# Patient Record
Sex: Male | Born: 1970
Health system: Southern US, Community
[De-identification: ages and names within clinical notes are randomized; demographics above are authoritative.]

## PROBLEM LIST (undated history)

## (undated) DIAGNOSIS — E785 Hyperlipidemia, unspecified: Secondary | ICD-10-CM

## (undated) DIAGNOSIS — J449 Chronic obstructive pulmonary disease, unspecified: Secondary | ICD-10-CM

## (undated) HISTORY — DX: Hyperlipidemia, unspecified: E78.5

## (undated) HISTORY — PX: FINGER SURGERY: SHX640

---

## 2017-08-25 ENCOUNTER — Inpatient Hospital Stay (HOSPITAL_COMMUNITY)
Admission: EM | Admit: 2017-08-25 | Discharge: 2017-08-27 | DRG: 202 | Disposition: A | Discharge status: Home / Self Care | Payer: Medicaid Other | Attending: Internal Medicine | Admitting: Internal Medicine

## 2017-08-25 ENCOUNTER — Other Ambulatory Visit: Payer: Self-pay

## 2017-08-25 ENCOUNTER — Emergency Department (HOSPITAL_COMMUNITY): Payer: Medicaid Other

## 2017-08-25 ENCOUNTER — Encounter (HOSPITAL_COMMUNITY): Payer: Self-pay

## 2017-08-25 DIAGNOSIS — F17210 Nicotine dependence, cigarettes, uncomplicated: Secondary | ICD-10-CM | POA: Diagnosis present

## 2017-08-25 DIAGNOSIS — R1013 Epigastric pain: Secondary | ICD-10-CM | POA: Diagnosis present

## 2017-08-25 DIAGNOSIS — Z8659 Personal history of other mental and behavioral disorders: Secondary | ICD-10-CM

## 2017-08-25 DIAGNOSIS — B349 Viral infection, unspecified: Secondary | ICD-10-CM | POA: Diagnosis present

## 2017-08-25 DIAGNOSIS — J449 Chronic obstructive pulmonary disease, unspecified: Secondary | ICD-10-CM | POA: Diagnosis present

## 2017-08-25 DIAGNOSIS — J440 Chronic obstructive pulmonary disease with acute lower respiratory infection: Secondary | ICD-10-CM | POA: Diagnosis present

## 2017-08-25 DIAGNOSIS — Z720 Tobacco use: Secondary | ICD-10-CM | POA: Diagnosis present

## 2017-08-25 DIAGNOSIS — J9601 Acute respiratory failure with hypoxia: Secondary | ICD-10-CM | POA: Diagnosis present

## 2017-08-25 DIAGNOSIS — J209 Acute bronchitis, unspecified: Principal | ICD-10-CM | POA: Diagnosis present

## 2017-08-25 DIAGNOSIS — J441 Chronic obstructive pulmonary disease with (acute) exacerbation: Secondary | ICD-10-CM | POA: Diagnosis present

## 2017-08-25 HISTORY — DX: Chronic obstructive pulmonary disease, unspecified: J44.9

## 2017-08-25 LAB — COMPREHENSIVE METABOLIC PANEL
ALBUMIN: 3.6 g/dL (ref 3.5–5.0)
ALT: 21 U/L (ref 17–63)
ANION GAP: 11 (ref 5–15)
AST: 23 U/L (ref 15–41)
Alkaline Phosphatase: 69 U/L (ref 38–126)
BUN: 7 mg/dL (ref 6–20)
CO2: 24 mmol/L (ref 22–32)
Calcium: 8.7 mg/dL — ABNORMAL LOW (ref 8.9–10.3)
Chloride: 106 mmol/L (ref 101–111)
Creatinine, Ser: 0.88 mg/dL (ref 0.61–1.24)
GFR calc Af Amer: >60 mL/min (ref >60)
GFR calc non Af Amer: >60 mL/min (ref >60)
GLUCOSE: 110 mg/dL — AB (ref 65–99)
Potassium: 3.7 mmol/L (ref 3.5–5.1)
SODIUM: 141 mmol/L (ref 135–145)
Total Bilirubin: 0.2 mg/dL — ABNORMAL LOW (ref 0.3–1.2)
Total Protein: 6.7 g/dL (ref 6.5–8.1)

## 2017-08-25 LAB — CBC WITH DIFFERENTIAL/PLATELET
Basophils Absolute: 0 10*3/uL (ref 0.0–0.1)
Basophils Relative: 0 %
Eosinophils Absolute: 0.1 10*3/uL (ref 0.0–0.7)
Eosinophils Relative: 1 %
HEMATOCRIT: 47.2 % (ref 39.0–52.0)
Hemoglobin: 15.7 g/dL (ref 13.0–17.0)
LYMPHS ABS: 1.4 10*3/uL (ref 0.7–4.0)
Lymphocytes Relative: 13 %
MCH: 29.5 pg (ref 26.0–34.0)
MCHC: 33.3 g/dL (ref 30.0–36.0)
MCV: 88.7 fL (ref 78.0–100.0)
MONO ABS: 0.7 10*3/uL (ref 0.1–1.0)
MONOS PCT: 6 %
NEUTROS ABS: 8.3 10*3/uL — AB (ref 1.7–7.7)
Neutrophils Relative %: 80 %
Platelets: 190 10*3/uL (ref 150–400)
RBC: 5.32 MIL/uL (ref 4.22–5.81)
RDW: 14 % (ref 11.5–15.5)
WBC: 10.4 10*3/uL (ref 4.0–10.5)

## 2017-08-25 LAB — I-STAT TROPONIN, ED: Troponin i, poc: 0 ng/mL (ref 0.00–0.08)

## 2017-08-25 LAB — INFLUENZA PANEL BY PCR (TYPE A & B)
INFLAPCR: NEGATIVE
Influenza B By PCR: NEGATIVE

## 2017-08-25 LAB — I-STAT CG4 LACTIC ACID, ED: Lactic Acid, Venous: 1.2 mmol/L (ref 0.5–1.9)

## 2017-08-25 MED ORDER — SODIUM CHLORIDE 0.9 % IV BOLUS (SEPSIS)
500.0000 mL | Freq: Once | INTRAVENOUS | Status: AC
  Administered 2017-08-25: 500 mL via INTRAVENOUS

## 2017-08-25 MED ORDER — ALBUTEROL (5 MG/ML) CONTINUOUS INHALATION SOLN
10.0000 mg/h | INHALATION_SOLUTION | Freq: Once | RESPIRATORY_TRACT | Status: AC
  Administered 2017-08-25: 10 mg/h via RESPIRATORY_TRACT
  Filled 2017-08-25: qty 20

## 2017-08-25 MED ORDER — ALBUTEROL SULFATE (2.5 MG/3ML) 0.083% IN NEBU
5.0000 mg | INHALATION_SOLUTION | Freq: Once | RESPIRATORY_TRACT | Status: AC
  Administered 2017-08-25: 5 mg via RESPIRATORY_TRACT
  Filled 2017-08-25: qty 6

## 2017-08-25 MED ORDER — OXYCODONE-ACETAMINOPHEN 5-325 MG PO TABS
1.0000 | ORAL_TABLET | Freq: Once | ORAL | Status: AC
  Administered 2017-08-25: 1 via ORAL
  Filled 2017-08-25: qty 1

## 2017-08-25 MED ORDER — FENTANYL CITRATE (PF) 100 MCG/2ML IJ SOLN
25.0000 ug | Freq: Once | INTRAMUSCULAR | Status: AC
Start: 2017-08-25 — End: 2017-08-25
  Administered 2017-08-25: 25 ug via INTRAMUSCULAR
  Filled 2017-08-25: qty 2

## 2017-08-25 MED ORDER — IBUPROFEN 800 MG PO TABS
800.0000 mg | ORAL_TABLET | Freq: Once | ORAL | Status: AC
  Administered 2017-08-25: 800 mg via ORAL
  Filled 2017-08-25: qty 1

## 2017-08-25 MED ORDER — IPRATROPIUM BROMIDE 0.02 % IN SOLN
0.5000 mg | Freq: Once | RESPIRATORY_TRACT | Status: AC
  Administered 2017-08-25: 0.5 mg via RESPIRATORY_TRACT
  Filled 2017-08-25: qty 2.5

## 2017-08-25 MED ORDER — METHYLPREDNISOLONE SODIUM SUCC 125 MG IJ SOLR
125.0000 mg | Freq: Once | INTRAMUSCULAR | Status: AC
  Administered 2017-08-25: 125 mg via INTRAVENOUS
  Filled 2017-08-25: qty 2

## 2017-08-25 NOTE — ED Provider Notes (Signed)
[facility] EMERGENCY DEPARTMENT Provider Note   CSN: [id number] Arrival date & time: 08/25/17  1230     History   Chief Complaint Chief Complaint  Patient presents with  . Shortness of Breath    HPI Randall Barry is a 47 y.o. male.  HPI   Pt is a 47 year old male with a history of tobacco use presents to the ED today complaining of worsening shortness of breath with exertion that began about a week ago, but worsened over the last 2 days.  States he has had subjective fever and chills as well as body aches.  He has had cough,, congestion, rhinorrhea, mild sore throat.  States he was initially his cough with productive with yellow sputum, now has become dry.  Denies any hemoptysis.  Denies any chest pain.  Also with mild headache.  No vision changes, lightheadedness, dizziness, numbness/tingling/weakness to bilateral upper or lower extreme ease.  No abdominal pain, nausea, vomiting, diarrhea.  No lower extremity swelling or edema.  No pain in the calves.  States he has no past medical history.  History reviewed. No pertinent past medical history.  There are no active problems to display for this patient.   History reviewed. No pertinent surgical history.     Home Medications    Prior to Admission medications   Medication Sig Start Date End Date Taking? Authorizing Provider  ibuprofen (ADVIL,MOTRIN) 200 MG tablet Take 200 mg by mouth every 6 (six) hours as needed.   Yes [provider]    Family History History reviewed. No pertinent family history.  Social History Social History   Tobacco Use  . Smoking status: Current Every Day Smoker    Packs/day: 1.50  Substance Use Topics  . Alcohol use: Yes    Comment: occ  . Drug use: Yes    Types: Marijuana    Comment: occ     Allergies   Patient has no known allergies.   Review of Systems Review of Systems  Constitutional: Positive for chills.  HENT: Positive for congestion,  rhinorrhea, sinus pressure, sinus pain and sore throat. Negative for trouble swallowing.   Eyes: Negative for pain and visual disturbance.  Respiratory: Positive for cough, shortness of breath and wheezing.   Cardiovascular: Negative for chest pain, palpitations and leg swelling.  Gastrointestinal: Negative for abdominal pain, constipation, diarrhea, nausea and vomiting.  Genitourinary: Negative for dysuria and hematuria.  Musculoskeletal: Negative for back pain, neck pain and neck stiffness.  Skin: Negative for rash.  Neurological: Positive for headaches. Negative for dizziness, syncope, weakness, light-headedness and numbness.  All other systems reviewed and are negative.    Physical Exam Updated Vital Signs BP 98/64   Pulse 74   Temp 98 F (36.7 C) (Oral)   Resp (!) 23   Ht 5\' 9"  (1.753 m) Comment: Simultaneous filing. User may not have seen previous data.  Wt 54.4 kg (120 lb) Comment: Simultaneous filing. User may not have seen previous data.  SpO2 97%   BMI 17.72 kg/m   Physical Exam  Constitutional: He appears well-developed and well-nourished.  Appears uncomfortable.  HENT:  Head: Normocephalic and atraumatic.  Mouth/Throat: Oropharynx is clear and moist.  Mild pharyngeal erythema.  No tonsillar swelling or exudates.  Uvula midline.  Eyes: Conjunctivae and EOM are normal. Pupils are equal, round, and reactive to light.  Neck: Normal range of motion. Neck supple. No JVD present.  Cardiovascular: Normal rate, regular rhythm, normal heart sounds and intact distal pulses.  No murmur heard. Pulmonary/Chest: He is in respiratory distress.  Tachypnea noted.  Decreased breath sounds throughout.  Wheezing noted diffusely.  No rales or rhonchi.  Patient is tripoding.  He is speaking in full sentences.  Has cough on exam.  Abdominal: Soft. Bowel sounds are normal. He exhibits no distension. There is no tenderness.  Musculoskeletal: He exhibits no edema.       Right lower leg:  Normal. He exhibits no tenderness and no edema.       Left lower leg: Normal. He exhibits no tenderness and no edema.  Neurological: He is alert.  Mental Status:  Alert, thought content appropriate, able to give a coherent history. Speech fluent without evidence of aphasia. Able to follow 2 step commands without difficulty.  Cranial Nerves:  II:   pupils equal, round, reactive to light III,IV, VI: ptosis not present, extra-ocular motions intact bilaterally  V,VII: smile symmetric, facial light touch sensation equal VIII: hearing grossly normal to voice  X: uvula elevates symmetrically  XI: bilateral shoulder shrug symmetric and strong XII: midline tongue extension without fassiculations Motor:  Normal tone. 5/5 strength of BUE and BLE major muscle groups including strong and equal grip strength and dorsiflexion/plantar flexion Sensory: light touch normal in all extremities. Cerebellar: normal finger-to-nose with bilateral upper extremities Gait: normal gait and balance. Able to walk on toes and heels with ease.  CV: 2+ radial and DP/PT pulses  Skin: Skin is warm and dry. Capillary refill takes less than 2 seconds.  Psychiatric: He has a normal mood and affect.  Nursing note and vitals reviewed.    ED Treatments / Results  Labs (all labs ordered are listed, but only abnormal results are displayed) Labs Reviewed  COMPREHENSIVE METABOLIC PANEL - Abnormal; Notable for the following components:      Result Value   Glucose, Bld 110 (*)    Calcium 8.7 (*)    Total Bilirubin 0.2 (*)    All other components within normal limits  CBC WITH DIFFERENTIAL/PLATELET - Abnormal; Notable for the following components:   Neutro Abs 8.3 (*)    All other components within normal limits  INFLUENZA PANEL BY PCR (TYPE A & B)  I-STAT CG4 LACTIC ACID, ED  I-STAT TROPONIN, ED    EKG  EKG Interpretation None       Radiology Dg Chest 2 View  Result Date: 08/25/2017 CLINICAL DATA:  Short of  breath EXAM: CHEST - 2 VIEW COMPARISON:  None. FINDINGS: Severe COPD with hyperinflation and emphysema. No focal infiltrate or effusion. Negative for heart failure or mass. Heart size is relatively small. IMPRESSION: Severe COPD without acute abnormality. Electronically Signed   By: Marlan Palauharles  Clark M.D.   On: 08/25/2017 12:58    Procedures Procedures (including critical care time) CRITICAL CARE Performed by: Karrie Meresortni S Ivory Maduro   Total critical care time: 45minutes  Critical care time was exclusive of separately billable procedures and treating other patients.  Critical care was necessary to treat or prevent imminent or life-threatening deterioration.  Critical care was time spent personally by me on the following activities: development of treatment plan with patient and/or surrogate as well as nursing, discussions with consultants, evaluation of patient's response to treatment, examination of patient, obtaining history from patient or surrogate, ordering and performing treatments and interventions, ordering and review of laboratory studies, ordering and review of radiographic studies, pulse oximetry and re-evaluation of patient's condition.   Medications Ordered in ED Medications  ipratropium (ATROVENT) nebulizer solution 0.5 mg (0.5  mg Nebulization Given 08/25/17 1504)  albuterol (PROVENTIL) (2.5 MG/3ML) 0.083% nebulizer solution 5 mg (5 mg Nebulization Given 08/25/17 1504)  methylPREDNISolone sodium succinate (SOLU-MEDROL) 125 mg/2 mL injection 125 mg (125 mg Intravenous Given 08/25/17 1545)  ibuprofen (ADVIL,MOTRIN) tablet 800 mg (800 mg Oral Given 08/25/17 1558)  albuterol (PROVENTIL) (2.5 MG/3ML) 0.083% nebulizer solution 5 mg (5 mg Nebulization Given 08/25/17 1558)  ipratropium (ATROVENT) nebulizer solution 0.5 mg (0.5 mg Nebulization Given 08/25/17 1557)  albuterol (PROVENTIL,VENTOLIN) solution continuous neb (10 mg/hr Nebulization Given 08/25/17 1727)  fentaNYL (SUBLIMAZE) injection 25 mcg  (25 mcg Intramuscular Given 08/25/17 1750)  sodium chloride 0.9 % bolus 500 mL (0 mLs Intravenous Stopped 08/25/17 2007)  albuterol (PROVENTIL,VENTOLIN) solution continuous neb (10 mg/hr Nebulization Given 08/25/17 1945)  oxyCODONE-acetaminophen (PERCOCET/ROXICET) 5-325 MG per tablet 1 tablet (1 tablet Oral Given 08/25/17 2146)     Initial Impression / Assessment and Plan / ED Course  I have reviewed the triage vital signs and the nursing notes.  Pertinent labs & imaging results that were available during my care of the patient were reviewed by me and considered in my medical decision making (see chart for details).  Discussed pt presentation and exam findings with Dr. Effie Shy, who agrees with the plan to admit pt for COPD exacerbation.     Patient still has decreased breath sounds throughout after first nebulizer treatment.  Does still have wheezing present.  Repeat pulmonary exam after second nebulizer treatment reveals continued decreased lung sounds.  Patient still tripoding.  Will initiate continuous nebulizer treatment.  Rechecked patient, laying on back with normal respiratory rate.  States he feels short of breath still.  Has not received continuous nebulizer treatment.  Still tachypneic and out of breath after continuous nebulizer treatment.  Ambulated and sats dropped to 90%.  Will admit to hospitalist.  11:02 PM Discussed case with Dr. Mariea Clonts who will admit the pt to the hospitalist service.  Final Clinical Impressions(s) / ED Diagnoses   Final diagnoses:  COPD exacerbation (HCC)   47 y/o male h/o tobacco use presenting with 1 week h/o dyspnea.   Afebrile with normal BP and HR. O2 sats in low 90s on RA. And tachypneic throughout stay in ED.  ECG with NSR Hr 91.  No ST elevations or depressions suggestive of ischemia. RAE noted. Doubt ACS as no CP. Doubt PE. Doubt CHF.  Chest x-ray shows severe COPD without evidence of pneumonia, pneumothorax.  No cardiomegaly.  Patient  has no prior diagnosis of COPD and is not currently being treated for this and does not use oxygen at home, however does have significant risk factors and appears to have COPD exacerbation today.  Patient given 2 DuoNeb treatments and two continuous nebs as well as Solu-Medrol.  Was emulated and sats dropped to 90% on room air and patient was taken back with this. Continues to feel dyspneic and has been placed on 2L [place].   Will require admission for further tx of COPD exacerbation. Admitted to hospitalist service.  ED Discharge Orders    None       Rayne Du 08/25/17 2303    Mancel Bale, MD 08/27/17 [phone]

## 2017-08-25 NOTE — ED Notes (Signed)
Pt again c/o headache.

## 2017-08-25 NOTE — ED Triage Notes (Signed)
Pt endorses shob, worse with exertion x 2 days. Pt has had cold sx and chills since last week. Able to speak in complete sentences.

## 2017-08-25 NOTE — ED Notes (Signed)
Ambulated PT in hallway.  O2 stayed at 90%.  PT seemed out of breath while walking.

## 2017-08-26 ENCOUNTER — Encounter (HOSPITAL_COMMUNITY): Payer: Self-pay | Admitting: *Deleted

## 2017-08-26 ENCOUNTER — Other Ambulatory Visit: Payer: Self-pay

## 2017-08-26 DIAGNOSIS — F17210 Nicotine dependence, cigarettes, uncomplicated: Secondary | ICD-10-CM | POA: Diagnosis present

## 2017-08-26 DIAGNOSIS — B349 Viral infection, unspecified: Secondary | ICD-10-CM | POA: Diagnosis present

## 2017-08-26 DIAGNOSIS — J449 Chronic obstructive pulmonary disease, unspecified: Secondary | ICD-10-CM | POA: Diagnosis present

## 2017-08-26 DIAGNOSIS — J441 Chronic obstructive pulmonary disease with (acute) exacerbation: Secondary | ICD-10-CM | POA: Diagnosis present

## 2017-08-26 DIAGNOSIS — J208 Acute bronchitis due to other specified organisms: Secondary | ICD-10-CM

## 2017-08-26 DIAGNOSIS — J440 Chronic obstructive pulmonary disease with acute lower respiratory infection: Secondary | ICD-10-CM | POA: Diagnosis present

## 2017-08-26 DIAGNOSIS — Z8659 Personal history of other mental and behavioral disorders: Secondary | ICD-10-CM | POA: Diagnosis not present

## 2017-08-26 DIAGNOSIS — R1013 Epigastric pain: Secondary | ICD-10-CM | POA: Diagnosis present

## 2017-08-26 DIAGNOSIS — J209 Acute bronchitis, unspecified: Secondary | ICD-10-CM | POA: Diagnosis present

## 2017-08-26 DIAGNOSIS — Z720 Tobacco use: Secondary | ICD-10-CM | POA: Diagnosis present

## 2017-08-26 DIAGNOSIS — J9601 Acute respiratory failure with hypoxia: Secondary | ICD-10-CM | POA: Diagnosis present

## 2017-08-26 LAB — RAPID URINE DRUG SCREEN, HOSP PERFORMED
AMPHETAMINES: NOT DETECTED
BARBITURATES: NOT DETECTED
BENZODIAZEPINES: NOT DETECTED
Cocaine: NOT DETECTED
Opiates: NOT DETECTED
Tetrahydrocannabinol: POSITIVE — AB

## 2017-08-26 LAB — TROPONIN I
Troponin I: <0.03 ng/mL (ref <0.03)
Troponin I: <0.03 ng/mL (ref <0.03)

## 2017-08-26 LAB — HIV ANTIBODY (ROUTINE TESTING W REFLEX): HIV SCREEN 4TH GENERATION: NONREACTIVE

## 2017-08-26 MED ORDER — IPRATROPIUM-ALBUTEROL 0.5-2.5 (3) MG/3ML IN SOLN
3.0000 mL | Freq: Four times a day (QID) | RESPIRATORY_TRACT | Status: DC
  Administered 2017-08-27 (×3): 3 mL via RESPIRATORY_TRACT
  Filled 2017-08-26 (×3): qty 3

## 2017-08-26 MED ORDER — DM-GUAIFENESIN ER 30-600 MG PO TB12
1.0000 | ORAL_TABLET | Freq: Two times a day (BID) | ORAL | Status: DC
  Administered 2017-08-26 – 2017-08-27 (×4): 1 via ORAL
  Filled 2017-08-26 (×4): qty 1

## 2017-08-26 MED ORDER — ENOXAPARIN SODIUM 40 MG/0.4ML ~~LOC~~ SOLN
40.0000 mg | SUBCUTANEOUS | Status: DC
  Administered 2017-08-26: 40 mg via SUBCUTANEOUS
  Filled 2017-08-26 (×2): qty 0.4

## 2017-08-26 MED ORDER — FAMOTIDINE 20 MG PO TABS
20.0000 mg | ORAL_TABLET | Freq: Every day | ORAL | Status: DC
  Administered 2017-08-26 (×2): 20 mg via ORAL
  Filled 2017-08-26 (×2): qty 1

## 2017-08-26 MED ORDER — SODIUM CHLORIDE 0.9 % IV BOLUS (SEPSIS)
1000.0000 mL | Freq: Once | INTRAVENOUS | Status: AC
  Administered 2017-08-26: 1000 mL via INTRAVENOUS

## 2017-08-26 MED ORDER — METHYLPREDNISOLONE SODIUM SUCC 40 MG IJ SOLR
40.0000 mg | Freq: Two times a day (BID) | INTRAMUSCULAR | Status: DC
  Administered 2017-08-26 – 2017-08-27 (×3): 40 mg via INTRAVENOUS
  Filled 2017-08-26 (×3): qty 1

## 2017-08-26 MED ORDER — BUDESONIDE 0.5 MG/2ML IN SUSP
0.5000 mg | Freq: Two times a day (BID) | RESPIRATORY_TRACT | Status: DC
  Administered 2017-08-26 – 2017-08-27 (×3): 0.5 mg via RESPIRATORY_TRACT
  Filled 2017-08-26 (×3): qty 2

## 2017-08-26 MED ORDER — IPRATROPIUM-ALBUTEROL 0.5-2.5 (3) MG/3ML IN SOLN: 3.0000 mL | Freq: Four times a day (QID) | RESPIRATORY_TRACT | Status: DC

## 2017-08-26 MED ORDER — ALBUTEROL SULFATE (2.5 MG/3ML) 0.083% IN NEBU: 2.5000 mg | INHALATION_SOLUTION | RESPIRATORY_TRACT | Status: DC | PRN

## 2017-08-26 MED ORDER — KETOROLAC TROMETHAMINE 30 MG/ML IJ SOLN: 30.0000 mg | Freq: Three times a day (TID) | INTRAMUSCULAR | Status: AC | PRN

## 2017-08-26 MED ORDER — NICOTINE 21 MG/24HR TD PT24
21.0000 mg | MEDICATED_PATCH | Freq: Every day | TRANSDERMAL | Status: DC
  Administered 2017-08-26 – 2017-08-27 (×2): 21 mg via TRANSDERMAL
  Filled 2017-08-26 (×3): qty 1

## 2017-08-26 MED ORDER — IBUPROFEN 600 MG PO TABS
600.0000 mg | ORAL_TABLET | Freq: Four times a day (QID) | ORAL | Status: AC | PRN
  Administered 2017-08-26 (×2): 600 mg via ORAL
  Filled 2017-08-26 (×2): qty 1

## 2017-08-26 MED ORDER — CALCIUM CARBONATE ANTACID 500 MG PO CHEW
1.0000 | CHEWABLE_TABLET | Freq: Two times a day (BID) | ORAL | Status: DC
Start: 2017-08-26 — End: 2017-08-27
  Administered 2017-08-26 – 2017-08-27 (×4): 200 mg via ORAL
  Filled 2017-08-26 (×5): qty 1

## 2017-08-26 MED ORDER — IPRATROPIUM-ALBUTEROL 0.5-2.5 (3) MG/3ML IN SOLN
3.0000 mL | RESPIRATORY_TRACT | Status: DC
  Administered 2017-08-26 (×4): 3 mL via RESPIRATORY_TRACT
  Filled 2017-08-26 (×4): qty 3

## 2017-08-26 MED ORDER — SODIUM CHLORIDE 0.9 % IV SOLN
500.0000 mg | Freq: Every day | INTRAVENOUS | Status: DC
  Administered 2017-08-26 (×2): 500 mg via INTRAVENOUS
  Filled 2017-08-26 (×2): qty 500
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
## 2017-08-26 NOTE — ED Notes (Signed)
Admitting MD at bedside.

## 2017-08-26 NOTE — Plan of Care (Signed)
Discussed with patient plan of care for the unit, pain management and nicotine patch with some teach back displayed

## 2017-08-26 NOTE — Progress Notes (Signed)
Patient admitted by Dr. Mariea ClontsEmokpae. 47 year old with history of tobacco use not on any home medications came to the hospital with complains of shortness of breath for the past 4 days but has progressed over the course of last 2 days.  He was admitted for acute bronchitis with likely underlying COPD exacerbation. This morning patient still admits of feeling short of breath even at rest and unable to bring up any cough but feels very congested. Vital signs remained stable.  He is currently on 2-2 L nasal cannula Breath sounds are very diminished diffusely  At this time we will continue patient on Solu-Medrol 40 mg IV twice daily, nebulizer scheduled and as necessary.  Continue azithromycin. I will add Pulmicort.  Once this episode is resolved he will need outpatient PFTs in about 4 weeks  Please call with further questions as necessary

## 2017-08-27 ENCOUNTER — Telehealth: Payer: Self-pay

## 2017-08-27 DIAGNOSIS — J441 Chronic obstructive pulmonary disease with (acute) exacerbation: Secondary | ICD-10-CM

## 2017-08-27 DIAGNOSIS — Z720 Tobacco use: Secondary | ICD-10-CM

## 2017-08-27 DIAGNOSIS — J209 Acute bronchitis, unspecified: Principal | ICD-10-CM

## 2017-08-27 DIAGNOSIS — J9601 Acute respiratory failure with hypoxia: Secondary | ICD-10-CM

## 2017-08-27 MED ORDER — PREDNISONE 10 MG PO TABS: ORAL_TABLET | ORAL | 0 refills | Status: DC

## 2017-08-27 MED ORDER — ALBUTEROL SULFATE HFA 108 (90 BASE) MCG/ACT IN AERS: 2.0000 | INHALATION_SPRAY | Freq: Four times a day (QID) | RESPIRATORY_TRACT | 0 refills | Status: DC | PRN

## 2017-08-27 MED ORDER — AZITHROMYCIN 250 MG PO TABS
500.0000 mg | ORAL_TABLET | Freq: Once | ORAL | Status: AC
  Administered 2017-08-27: 500 mg via ORAL
  Filled 2017-08-27: qty 2

## 2017-08-27 MED ORDER — PREDNISONE 20 MG PO TABS
40.0000 mg | ORAL_TABLET | Freq: Once | ORAL | Status: AC
  Administered 2017-08-27: 40 mg via ORAL
  Filled 2017-08-27: qty 2

## 2017-08-27 MED ORDER — NICOTINE 21 MG/24HR TD PT24: 21.0000 mg | MEDICATED_PATCH | Freq: Every day | TRANSDERMAL | 0 refills | Status: DC

## 2017-08-27 MED ORDER — ALBUTEROL SULFATE HFA 108 (90 BASE) MCG/ACT IN AERS
2.0000 | INHALATION_SPRAY | Freq: Four times a day (QID) | RESPIRATORY_TRACT | 0 refills | Status: DC | PRN
Start: 2017-08-27 — End: 2017-08-27

## 2017-08-27 MED FILL — NICOTINE 21 MG/24HR PATCH: 21 | 28 days supply | Qty: 28 | Fill #0

## 2017-08-27 MED FILL — predniSONE 10 MG TABS: 10 | 8 days supply | Qty: 20 | Fill #0

## 2017-08-27 MED FILL — ALBUTEROL SULFATE HFA 108 (: 108 (90 BAS | 25 days supply | Qty: 18 | Fill #0

## 2017-08-27 NOTE — Telephone Encounter (Signed)
Message received from Letha Capeeborah Taylor, RN CM requesting a hospital follow up appointment [facility].  An appointment was scheduled for 09/10/17 @ 1000 and the information was placed on the AVS.  Update provided to D. Ladona Ridgelaylor, RN CM

## 2017-08-27 NOTE — Progress Notes (Signed)
Patient has been discharged from the unit. Patient was educated and informed about follow up appointments and medications. His wife also present at bedside at time of teaching. He reports that he has no further questions.

## 2017-08-27 NOTE — Progress Notes (Addendum)
SATURATION QUALIFICATIONS: (This note is used to comply with regulatory documentation for home oxygen)  Patient Saturations on Room Air at Rest = 94%  Patient Saturations on Room Air while Ambulating = 87%  Patient Saturations on 2 Liters of oxygen while Ambulating = 91 %  Please briefly explain why patient needs home oxygen:  Sitting at the bedside patient is O2 sats is in the 94 on room air. While ambulating patient expressed feeling SOB without the O2. Ambulated better while on 2L of [place] and was able to speak more freely with O2.

## 2017-08-27 NOTE — Discharge Instructions (Signed)
Please get your medications reviewed and adjusted by your Primary MD. ° °Please request your Primary MD to go over all Hospital Tests and Procedure/Radiological results at the follow up, please get all Hospital records sent to your Prim MD by signing hospital release before you go home. ° °If you had Pneumonia of Lung problems at the Hospital: °Please get a 2 view Chest X ray done in 6-8 weeks after hospital discharge or sooner if instructed by your Primary MD. ° °If you have Congestive Heart Failure: °Please call your Cardiologist or Primary MD anytime you have any of the following symptoms:  °1) 3 pound weight gain in 24 hours or 5 pounds in 1 week  °2) shortness of breath, with or without a dry hacking cough  °3) swelling in the hands, feet or stomach  °4) if you have to sleep on extra pillows at night in order to breathe ° °Follow cardiac low salt diet and 1.5 lit/day fluid restriction. ° °If you have diabetes °Accuchecks 4 times/day, Once in AM empty stomach and then before each meal. °Log in all results and show them to your primary doctor at your next visit. °If any glucose reading is under 80 or above 300 call your primary MD immediately. ° °If you have Seizure/Convulsions/Epilepsy: °Please do not drive, operate heavy machinery, participate in activities at heights or participate in high speed sports until you have seen by Primary MD or a Neurologist and advised to do so again. ° °If you had Gastrointestinal Bleeding: °Please ask your Primary MD to check a complete blood count within one week of discharge or at your next visit. Your endoscopic/colonoscopic biopsies that are pending at the time of discharge, will also need to followed by your Primary MD. ° °Get Medicines reviewed and adjusted. °Please take all your medications with you for your next visit with your Primary MD ° °Please request your Primary MD to go over all hospital tests and procedure/radiological results at the follow up, please ask your  Primary MD to get all Hospital records sent to his/her office. ° °If you experience worsening of your admission symptoms, develop shortness of breath, life threatening emergency, suicidal or homicidal thoughts you must seek medical attention immediately by calling 911 or calling your MD immediately  if symptoms less severe. ° °You must read complete instructions/literature along with all the possible adverse reactions/side effects for all the Medicines you take and that have been prescribed to you. Take any new Medicines after you have completely understood and accpet all the possible adverse reactions/side effects.  ° °Do not drive or operate heavy machinery when taking Pain medications.  ° °Do not take more than prescribed Pain, Sleep and Anxiety Medications ° °Special Instructions: If you have smoked or chewed Tobacco  in the last 2 yrs please stop smoking, stop any regular Alcohol  and or any Recreational drug use. ° °Wear Seat belts while driving. ° °Please note °You were cared for by a hospitalist during your hospital stay. If you have any questions about your discharge medications or the care you received while you were in the hospital after you are discharged, you can call the unit and asked to speak with the hospitalist on call if the hospitalist that took care of you is not available. Once you are discharged, your primary care physician will handle any further medical issues. Please note that NO REFILLS for any discharge medications will be authorized once you are discharged, as it is imperative that you   return to your primary care physician (or establish a relationship with a primary care physician if you do not have one) for your aftercare needs so that they can reassess your need for medications and monitor your lab values. ° °You can reach the hospitalist office at phone [phone] or fax [phone] °  °If you do not have a primary care physician, you can call [phone] for a physician  referral. ° ° °Chronic Obstructive Pulmonary Disease Exacerbation °Chronic obstructive pulmonary disease (COPD) is a common lung problem. In COPD, the flow of air from the lungs is limited. COPD exacerbations are times that breathing gets worse and you need extra treatment. Without treatment they can be life threatening. If they happen often, your lungs can become more damaged. If your COPD gets worse, your doctor may treat you with: °· Medicines. °· Oxygen. °· Different ways to clear your airway, such as using a mask. ° °Follow these instructions at home: °· Do not smoke. °· Avoid tobacco smoke and other things that bother your lungs. °· If given, take your antibiotic medicine as told. Finish the medicine even if you start to feel better. °· Only take medicines as told by your doctor. °· Drink enough fluids to keep your pee (urine) clear or pale yellow (unless your doctor has told you not to). °· Use a cool mist machine (vaporizer). °· If you use oxygen or a machine that turns liquid medicine into a mist (nebulizer), continue to use them as told. °· Keep up with shots (vaccinations) as told by your doctor. °· Exercise regularly. °· Eat healthy foods. °· Keep all doctor visits as told. °Get help right away if: °· You are very short of breath and it gets worse. °· You have trouble talking. °· You have bad chest pain. °· You have blood in your spit (sputum). °· You have a fever. °· You keep throwing up (vomiting). °· You feel weak, or you pass out (faint). °· You feel confused. °· You keep getting worse. °This information is not intended to replace advice given to you by your health care provider. Make sure you discuss any questions you have with your health care provider. °Document Released: 05/16/2011 Document Revised: 11/02/2015 Document Reviewed: 01/29/2013 °Elsevier Interactive Patient Education © 2017 [employer] ° °

## 2017-08-27 NOTE — Care Management Note (Signed)
Case Management Note  Patient Details  Name: Randall Barry MRN: [id number] Date of Birth: 06-23-1970  Subjective/Objective:   [facility] he will need printed scripts. He does qualify for charity for home oxygen with [facility], they will bring it up to patients room.  He will go to [facility] clinic to get his medications, the clinic closes at 5 pm.                    Action/Plan: DC home today, [facility]  Expected Discharge Date:  08/27/17               Expected Discharge Plan:  Home/Self Care  In-House Referral:     Discharge planning Services  CM Consult, Follow-up appt scheduled, Medication Assistance, Indigent Health Clinic  Post Acute Care Choice:  Durable Medical Equipment Choice offered to:  Patient  DME Arranged:  Oxygen DME Agency:  [facility]  [facility]     Status of Service:  Completed, signed off  If discussed at Long Length of Stay Meetings, dates discussed:    Additional Comments:  Leone Havenaylor, Mostyn Varnell Clinton, RN 08/27/2017, 2:43 PM

## 2017-08-27 NOTE — Discharge Summary (Addendum)
Physician Discharge Summary  Randall Barry ONG:[id number] DOB: 06-14-1970  PCP: Patient, No Pcp Per  Admit date: 08/25/2017 Discharge date: 08/27/2017  Recommendations for Outpatient Follow-up:  1. Dr. Shan LevansPatrick Wright, new PCP/Pulmonology on 09/10/17 at 10 AM.  Home Health: None Equipment/Devices: Home oxygen at 2 L/min continuously.  Discharge Condition: Improved and stable CODE STATUS: Full Diet recommendation: Heart healthy diet.  Discharge Diagnoses:  Active Problems:   Acute bronchitis   Tobacco abuse   COPD exacerbation (HCC)   Brief Summary: 47 year old male with PMH of ongoing tobacco abuse, chronic dyspnea on exertion for a couple of years, presented to ED due to 1 week history of progressively worsening dyspnea that got even worse 2 days prior to admission associated with cough with yellow sputum, sore throat and rhinorrhea.  Denied sick contacts.  Denied getting flu shot this year.  Transient heartburn after medications in ED.  Admitted for COPD exacerbation and acute hypoxic respiratory failure.  Assessment and plan:  1. 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  This was likely precipitated by acute viral URTI/bronchitis.  Chest x-ray showed severe COPD without acute abnormalities.  Influenza PCR negative.  HIV antibody screen negative.  He was treated with oxygen, IV Solu-Medrol, azithromycin, bronchodilators and Pulmicort.  Clinically improved.  Completed 3 days course of azithromycin prior to discharge.  He will be discharged on oral prednisone taper, as needed albuterol inhaler, tobacco cessation has been clearly counseled, flutter valve provided.  Outpatient follow-up and recommend checking PFTs when acute phase has resolved. 2. Acute hypoxic respiratory failure:  Patient may have chronic undiagnosed hypoxia related to COPD.  Qualified for home oxygen.  Case management was consulted for same. 3. Tobacco abuse: Cessation counseled.  Reportedly tried Chantix in the past but developed suicidal ideations.  Continue nicotine patch that was started in the hospital. 4. THC use: Cessation counseled. 5. Transient dyspepsia: No chest pain.  Troponins x3-.  Resolved.   Consultations:  None  Procedures:  None   Discharge Instructions  Discharge Instructions    Call MD for:  difficulty breathing, headache or visual disturbances   Complete by:  As directed    Call MD for:  extreme fatigue   Complete by:  As directed    Call MD for:  persistant dizziness or light-headedness   Complete by:  As directed    Diet - low sodium heart healthy   Complete by:  As directed    Increase activity slowly   Complete by:  As directed        Medication List    TAKE these medications   albuterol 108 (90 Base) MCG/ACT inhaler Commonly known as:  PROVENTIL HFA;VENTOLIN HFA Inhale 2 puffs into the lungs every 6 (six) hours as needed for wheezing or shortness of breath.   ibuprofen 200 MG tablet Commonly known as:  ADVIL,MOTRIN Take 200 mg by mouth every 6 (six) hours as needed.   nicotine 21 mg/24hr patch Commonly known as:  NICODERM CQ - dosed in mg/24 hours Place 1 patch (21 mg total) onto the skin daily. Start taking on:  08/28/2017   predniSONE 10 MG tablet Commonly known as:  DELTASONE Take 4 tabs daily for 2 days, then 3 tabs daily for 2 days, then 2 tabs daily for 2 days, then  1 tab daily for 2 days, then stop. Start taking on:  08/28/2017      Follow-up Information    [facility]. Go on 09/10/2017.   Why:  at 10:00am with Dr Delford Field.  Contact information: [address] [place] [address] [phone]         No Known Allergies    Procedures/Studies: Dg Chest 2 View  Result Date:  08/25/2017 CLINICAL DATA:  Short of breath EXAM: CHEST - 2 VIEW COMPARISON:  None. FINDINGS: Severe COPD with hyperinflation and emphysema. No focal infiltrate or effusion. Negative for heart failure or mass. Heart size is relatively small. IMPRESSION: Severe COPD without acute abnormality. Electronically Signed   By: Marlan Palau M.D.   On: 08/25/2017 12:58      Subjective: Reports that he feels better.  Dyspnea improved more than 50%.  Minimal intermittent dry cough.  No chest pain or chest tightness.  Denies suicidal or homicidal ideations.  As per RN, no acute issues reported.  Discharge Exam:  Vitals:   08/26/17 2114 08/27/17 0613 08/27/17 0753 08/27/17 1143  BP: (!) 98/58 93/67    Pulse: 72 66    Resp: 18     Temp: 97.8 F (36.6 C) (!) 97.5 F (36.4 C)    TempSrc: Oral Oral    SpO2: 98% 96% 92% 92%  Weight:      Height:        General: Pleasant young male, moderately built and nourished, lying comfortably propped up in bed. Cardiovascular: S1 & S2 heard, RRR, S1/S2 +. No murmurs, rubs, gallops or clicks. No JVD or pedal edema.  Telemetry personally reviewed: SB in the 55-SR. Respiratory: Slightly distant appearing breath sounds but clear to auscultation without wheezing, rhonchi or crackles. No increased work of breathing.  Able to speak in full sentences without difficulty. Abdominal:  Non distended, non tender & soft. No organomegaly or masses appreciated. Normal bowel sounds heard. CNS: Alert and oriented. No focal deficits. Extremities: no edema, no cyanosis    The results of significant diagnostics from this hospitalization (including imaging, microbiology, ancillary and laboratory) are listed below for reference.     Microbiology: No results found for this or any previous visit (from the past 240 hour(s)).   Labs: CBC: Recent Labs  Lab 08/25/17 1242  WBC 10.4  NEUTROABS 8.3*  HGB 15.7  HCT 47.2  MCV 88.7  PLT 190   Basic Metabolic Panel: Recent Labs   Lab 08/25/17 1242  NA 141  K 3.7  CL 106  CO2 24  GLUCOSE 110*  BUN 7  CREATININE 0.88  CALCIUM 8.7*   Liver Function Tests: Recent Labs  Lab 08/25/17 1242  AST 23  ALT 21  ALKPHOS 69  BILITOT 0.2*  PROT 6.7  ALBUMIN 3.6   Cardiac Enzymes: Recent Labs  Lab 08/26/17 0204 08/26/17 0704 08/26/17 1224  TROPONINI <0.03 <0.03 <0.03      Time coordinating discharge: Less than 30 minutes  SIGNED:  Marcellus Scott, MD, FACP, [facility]. [facility] Pager [phone]  If 7PM-7AM, please contact night-coverage [email or web address] Password [email or web address] 08/27/2017, 1:52 PM

## 2017-08-27 NOTE — Progress Notes (Signed)
[facility] he will need printed scripts. He does qualify for charity for home oxygen with [facility], they will bring it up to patients room.  He will go to [facility] clinic to get his medications, the clinic closes at 5 pm.

## 2017-08-27 NOTE — Progress Notes (Signed)
Instructed in use of Flutter.  Good return demonstration, able to reapteat back instructions accurately.  Encouraged use Q1H w/a

## 2017-09-10 ENCOUNTER — Ambulatory Visit: Payer: Self-pay | Attending: Critical Care Medicine | Admitting: Critical Care Medicine

## 2017-09-10 ENCOUNTER — Encounter: Payer: Self-pay | Admitting: Critical Care Medicine

## 2017-09-10 VITALS — BP 114/84 | HR 85 | Temp 97.5°F | Resp 18 | Ht 69.0 in | Wt 128.4 lb

## 2017-09-10 DIAGNOSIS — Z79899 Other long term (current) drug therapy: Secondary | ICD-10-CM | POA: Insufficient documentation

## 2017-09-10 DIAGNOSIS — Z720 Tobacco use: Secondary | ICD-10-CM

## 2017-09-10 DIAGNOSIS — J440 Chronic obstructive pulmonary disease with acute lower respiratory infection: Secondary | ICD-10-CM | POA: Insufficient documentation

## 2017-09-10 DIAGNOSIS — J209 Acute bronchitis, unspecified: Secondary | ICD-10-CM | POA: Insufficient documentation

## 2017-09-10 DIAGNOSIS — F17210 Nicotine dependence, cigarettes, uncomplicated: Secondary | ICD-10-CM | POA: Insufficient documentation

## 2017-09-10 DIAGNOSIS — J9611 Chronic respiratory failure with hypoxia: Secondary | ICD-10-CM | POA: Insufficient documentation

## 2017-09-10 DIAGNOSIS — J441 Chronic obstructive pulmonary disease with (acute) exacerbation: Secondary | ICD-10-CM | POA: Insufficient documentation

## 2017-09-10 DIAGNOSIS — E785 Hyperlipidemia, unspecified: Secondary | ICD-10-CM | POA: Insufficient documentation

## 2017-09-10 MED ORDER — UMECLIDINIUM BROMIDE 62.5 MCG/INH IN AEPB: 1.0000 | INHALATION_SPRAY | Freq: Every day | RESPIRATORY_TRACT | 6 refills | Status: DC

## 2017-09-10 MED ORDER — NICOTINE POLACRILEX 4 MG MT LOZG: LOZENGE | OROMUCOSAL | 0 refills | Status: DC

## 2017-09-10 MED FILL — !INCRUSE ELLIPTA 62.5 MCG I: 62.5 | 30 days supply | Qty: 30 | Fill #0

## 2017-09-10 NOTE — Patient Instructions (Signed)
Start Incruse one puff daily Use albuterol 2 puff every 6 hours as needed  You may stay off oxygen in daytime , use oxygen 1 liter at night An overnight sleep oxygen test will be obtained by [facility] on ROOM AIR, we will call the results Focus on smoking cessation with nicotine 4mg  lozenge , dissolve in mouth use up to 4 per day Return May 8 for follow up

## 2017-09-10 NOTE — Assessment & Plan Note (Signed)
Copd with bronchitic and emphysematous components Plan Start incruse one puff daily Prn SABA No further steroids Needs PFTs  Wean oxygen to off, obtain an ONO on RA

## 2017-09-10 NOTE — Assessment & Plan Note (Signed)
Ambulatory sats normal Plan  check ONO on RA

## 2017-09-10 NOTE — Assessment & Plan Note (Signed)
3-10 min of smoking cessation given Pt to try nicotine lozenges 4mg  4-6 x per day

## 2017-09-10 NOTE — Progress Notes (Signed)
Subjective:    Patient ID: Randall Barry, male    DOB: 07-22-1970, 47 y.o.   MRN: [id number]  47 year old male with PMH of ongoing tobacco use: 36yrs 2PPD.  Grandfather had COPD paternal side. Hx of  chronic dyspnea on exertion for a couple of years, presented to ED due to 1 week history of progressively worsening dyspnea that got even worse 2 days prior to admission associated with cough with yellow sputum, sore throat and rhinorrhea.  Denied sick contacts.  Denied getting flu shot this year.  Transient heartburn after medications in ED.  Admitted for COPD exacerbation and acute hypoxic respiratory failure.  Since d/c back on 1 pack every two days.   Pt notes dyspnea is about the same.  No real mucus, just dry cough.      Shortness of Breath  This is a chronic problem. The current episode started more than 1 year ago. The problem occurs daily (Ok at rest, if go to [employer] or feed dog has to stop and catch breath). The problem has been gradually improving. Associated symptoms include headaches, PND, rhinorrhea and wheezing. Pertinent negatives include no abdominal pain, chest pain, fever, hemoptysis, leg pain, leg swelling, orthopnea, rash, sore throat, sputum production, swollen glands or vomiting. The symptoms are aggravated by exercise, any activity, lying flat, fumes, odors, URIs, weather changes, pollens and emotional upset (former roofer). Associated symptoms comments: Night sweats noted Not sleeping well Headaches are bifrontal and to the spine area GERD but better off steroids. Risk factors include smoking. He has tried beta agonist inhalers for the symptoms. The treatment provided moderate relief. His past medical history is significant for COPD. There is no history of asthma, CAD, a heart failure, PE or pneumonia.    Past Medical History:  Diagnosis Date  . COPD (chronic obstructive pulmonary disease) (HCC)   . Hyperlipidemia      History reviewed. No pertinent family  history.   Social History   Socioeconomic History  . Marital status: Married    Spouse name: Not on file  . Number of children: Not on file  . Years of education: Not on file  . Highest education level: Not on file  Occupational History  . Not on file  Social Needs  . Financial resource strain: Not on file  . Food insecurity:    Worry: Not on file    Inability: Not on file  . Transportation needs:    Medical: Not on file    Non-medical: Not on file  Tobacco Use  . Smoking status: Current Every Day Smoker    Packs/day: 1.50  . Smokeless tobacco: Never Used  Substance and Sexual Activity  . Alcohol use: Yes    Comment: occ  . Drug use: Yes    Types: Marijuana    Comment: occ  . Sexual activity: Not Currently    Birth control/protection: None  Lifestyle  . Physical activity:    Days per week: Not on file    Minutes per session: Not on file  . Stress: Not on file  Relationships  . Social connections:    Talks on phone: Not on file    Gets together: Not on file    Attends religious service: Not on file    Active member of club or organization: Not on file    Attends meetings of clubs or organizations: Not on file    Relationship status: Not on file  . Intimate partner violence:    Fear of  current or ex partner: Not on file    Emotionally abused: Not on file    Physically abused: Not on file    Forced sexual activity: Not on file  Other Topics Concern  . Not on file  Social History Narrative  . Not on file     No Known Allergies   Outpatient Medications Prior to Visit  Medication Sig Dispense Refill  . albuterol (PROVENTIL HFA;VENTOLIN HFA) 108 (90 Base) MCG/ACT inhaler Inhale 2 puffs into the lungs every 6 (six) hours as needed for wheezing or shortness of breath. 1 Inhaler 0  . ibuprofen (ADVIL,MOTRIN) 200 MG tablet Take 200 mg by mouth every 6 (six) hours as needed.    . nicotine (NICODERM CQ - DOSED IN MG/24 HOURS) 21 mg/24hr patch Place 1 patch (21 mg  total) onto the skin daily. 28 patch 0  . predniSONE (DELTASONE) 10 MG tablet Take 4 tabs daily for 2 days, then 3 tabs daily for 2 days, then 2 tabs daily for 2 days, then 1 tab daily for 2 days, then stop. (Patient not taking: Reported on 09/10/2017) 20 tablet 0   No facility-administered medications prior to visit.      Review of Systems  Constitutional: Negative for fever.  HENT: Positive for rhinorrhea. Negative for sore throat.   Respiratory: Positive for shortness of breath and wheezing. Negative for hemoptysis and sputum production.   Cardiovascular: Positive for PND. Negative for chest pain, orthopnea and leg swelling.  Gastrointestinal: Negative for abdominal pain and vomiting.  Skin: Negative for rash.  Neurological: Positive for headaches.       Objective:   Physical Exam Vitals:   09/10/17 1011  BP: 114/84  Pulse: 85  Resp: 18  Temp: (!) 97.5 F (36.4 C)  TempSrc: Oral  SpO2: 100%  Weight: 128 lb 6.4 oz (58.2 kg)  Height: 5\' 9"  (1.753 m)    Gen: Pleasant, thin but muscular, in no distress,  normal affect  ENT: No lesions,  mouth clear,  oropharynx clear, no postnasal drip  Neck: No JVD, no TMG, no carotid bruits  Lungs: No use of accessory muscles, no dullness to percussion, distant BS, hyperresonance to percussion  Cardiovascular: RRR, heart sounds normal, no murmur or gallops, no peripheral edema  Abdomen: soft and NT, no HSM,  BS normal  Musculoskeletal: No deformities, no cyanosis or clubbing  Neuro: alert, non focal  Skin: Warm, no lesions or rashes  No results found.  08/25/17 CXR: IMPRESSION: Severe COPD without acute abnormality.  All labs from 08/2017 hosp stay reviewed, no abn.        Assessment & Plan:  I personally reviewed all images and lab data in the [facility] system as well as any outside material available during this office visit and agree with the  radiology impressions.   COPD exacerbation (HCC) Copd with bronchitic and  emphysematous components Plan Start incruse one puff daily Prn SABA No further steroids Needs PFTs  Wean oxygen to off, obtain an ONO on RA  Acute bronchitis Acute bronchitis improved No further steroids or ABX  Chronic respiratory failure with hypoxia (HCC) Ambulatory sats normal Plan  check ONO on RA  Tobacco abuse 3-10 min of smoking cessation given Pt to try nicotine lozenges 4mg  4-6 x per day   Diagnoses and all orders for this visit:  COPD exacerbation (HCC) -     Pulse oximetry, overnight; Future  Chronic respiratory failure with hypoxia (HCC) -     Pulse oximetry,  overnight; Future  Tobacco abuse  Acute bronchitis, unspecified organism  Other orders -     umeclidinium bromide (INCRUSE ELLIPTA) 62.5 MCG/INH AEPB; Inhale 1 puff into the lungs daily. -     nicotine polacrilex (NICOTINE MINI) 4 MG lozenge; Use 4 per day    I had an extended discussion with the patient and or family lasting 10 minutes of a 25 minute visit including:  Smoking cessation

## 2017-09-10 NOTE — Assessment & Plan Note (Signed)
Acute bronchitis improved No further steroids or ABX

## 2017-09-10 NOTE — Progress Notes (Signed)
Patient complains headaches & shortness of breathe

## 2017-09-19 ENCOUNTER — Encounter: Payer: Self-pay | Admitting: Critical Care Medicine

## 2017-10-09 MED FILL — !INCRUSE ELLIPTA 62.5 MCG I: 62.5 | 30 days supply | Qty: 30 | Fill #1

## 2017-10-15 ENCOUNTER — Encounter: Payer: Self-pay | Admitting: Critical Care Medicine

## 2017-10-15 ENCOUNTER — Ambulatory Visit: Payer: Self-pay | Attending: Critical Care Medicine | Admitting: Critical Care Medicine

## 2017-10-15 ENCOUNTER — Telehealth (INDEPENDENT_AMBULATORY_CARE_PROVIDER_SITE_OTHER): Payer: Self-pay | Admitting: *Deleted

## 2017-10-15 VITALS — BP 122/84 | HR 77 | Temp 98.0°F | Resp 16 | Ht 69.0 in | Wt 125.4 lb

## 2017-10-15 DIAGNOSIS — Z79899 Other long term (current) drug therapy: Secondary | ICD-10-CM | POA: Insufficient documentation

## 2017-10-15 DIAGNOSIS — Z720 Tobacco use: Secondary | ICD-10-CM

## 2017-10-15 DIAGNOSIS — J9611 Chronic respiratory failure with hypoxia: Secondary | ICD-10-CM | POA: Insufficient documentation

## 2017-10-15 DIAGNOSIS — E785 Hyperlipidemia, unspecified: Secondary | ICD-10-CM | POA: Insufficient documentation

## 2017-10-15 DIAGNOSIS — J441 Chronic obstructive pulmonary disease with (acute) exacerbation: Secondary | ICD-10-CM | POA: Insufficient documentation

## 2017-10-15 DIAGNOSIS — J449 Chronic obstructive pulmonary disease, unspecified: Secondary | ICD-10-CM

## 2017-10-15 DIAGNOSIS — F17210 Nicotine dependence, cigarettes, uncomplicated: Secondary | ICD-10-CM | POA: Insufficient documentation

## 2017-10-15 NOTE — Patient Instructions (Addendum)
Stay on incruse Use albuterol as needed A breathing test will be maintained Return 3 months

## 2017-10-15 NOTE — Assessment & Plan Note (Signed)
Still with desat at night down to 83% RA Stay on oxygen 2L qhs

## 2017-10-15 NOTE — Assessment & Plan Note (Signed)
Copd needs PFTs.  Ongoing tobacco use Plan  tobacco counseling Cont incruse Prn SABA F/u on ONO RA

## 2017-10-15 NOTE — Telephone Encounter (Signed)
MA spoke with [facility] rep to confirm patients O2 saturation stats. Patient was monitored for 7 hours 40 minutes and 2 seconds. Patients lowest stat resulted in 83%. Patient remained at 88% or below for 3 hours, 14 minutes and 4 seconds.

## 2017-10-15 NOTE — Assessment & Plan Note (Signed)
Tobacco counseling advised

## 2017-10-15 NOTE — Progress Notes (Signed)
Subjective:    Patient ID: Randall Barry, male    DOB: 03/15/1971, 47 y.o.   MRN: [id number]  47 year old male with PMH of ongoing tobacco use: 9yrs 2PPD.  Grandfather had COPD paternal side. Hx of  chronic dyspnea on exertion for a couple of years, presented to ED due to 1 week history of progressively worsening dyspnea that got even worse 2 days prior to admission associated with cough with yellow sputum, sore throat and rhinorrhea.  Denied sick contacts.  Denied getting flu shot this year.  Transient heartburn after medications in ED.  Admitted for COPD exacerbation and acute hypoxic respiratory failure.  Since d/c back on 1 pack every two days.   Pt notes dyspnea is about the same.  No real mucus, just dry cough.    10/15/2017 Not much cough.  On incruse.  Now smoking more.  Now on 1PPD.      Shortness of Breath  This is a chronic problem. The current episode started more than 1 year ago. The problem occurs daily (Ok at rest, if go to [employer] or feed dog has to stop and catch breath). The problem has been gradually improving. Associated symptoms include headaches, PND and wheezing. Pertinent negatives include no abdominal pain, chest pain, fever, hemoptysis, leg pain, leg swelling, orthopnea, rash, rhinorrhea, sore throat, sputum production, swollen glands or vomiting. The symptoms are aggravated by exercise, any activity, lying flat, fumes, odors, URIs, weather changes, pollens and emotional upset (former roofer). Associated symptoms comments: Night sweats noted Not sleeping well Headaches are bifrontal and to the spine area GERD but better off steroids. Risk factors include smoking. He has tried beta agonist inhalers for the symptoms. The treatment provided moderate relief. His past medical history is significant for COPD. There is no history of asthma, CAD, a heart failure, PE or pneumonia.  COPD  He complains of chest tightness, cough, difficulty breathing, shortness of breath and  wheezing. There is no hemoptysis or sputum production. The cough is non-productive. Associated symptoms include headaches and PND. Pertinent negatives include no chest pain, fever, rhinorrhea or sore throat. His past medical history is significant for COPD. There is no history of asthma or pneumonia.    Past Medical History:  Diagnosis Date  . COPD (chronic obstructive pulmonary disease) (HCC)   . Hyperlipidemia      No family history on file.   Social History   Socioeconomic History  . Marital status: Married    Spouse name: Not on file  . Number of children: Not on file  . Years of education: Not on file  . Highest education level: Not on file  Occupational History  . Not on file  Social Needs  . Financial resource strain: Not on file  . Food insecurity:    Worry: Not on file    Inability: Not on file  . Transportation needs:    Medical: Not on file    Non-medical: Not on file  Tobacco Use  . Smoking status: Current Every Day Smoker    Packs/day: 1.00  . Smokeless tobacco: Never Used  Substance and Sexual Activity  . Alcohol use: Not Currently  . Drug use: Yes    Types: Marijuana    Comment: occ  . Sexual activity: Not Currently    Birth control/protection: None  Lifestyle  . Physical activity:    Days per week: Not on file    Minutes per session: Not on file  . Stress: Not on file  Relationships  . Social connections:    Talks on phone: Not on file    Gets together: Not on file    Attends religious service: Not on file    Active member of club or organization: Not on file    Attends meetings of clubs or organizations: Not on file    Relationship status: Not on file  . Intimate partner violence:    Fear of current or ex partner: Not on file    Emotionally abused: Not on file    Physically abused: Not on file    Forced sexual activity: Not on file  Other Topics Concern  . Not on file  Social History Narrative  . Not on file     No Known Allergies    Outpatient Medications Prior to Visit  Medication Sig Dispense Refill  . albuterol (PROVENTIL HFA;VENTOLIN HFA) 108 (90 Base) MCG/ACT inhaler Inhale 2 puffs into the lungs every 6 (six) hours as needed for wheezing or shortness of breath. 1 Inhaler 0  . ibuprofen (ADVIL,MOTRIN) 200 MG tablet Take 200 mg by mouth every 6 (six) hours as needed.    . nicotine (NICODERM CQ - DOSED IN MG/24 HOURS) 21 mg/24hr patch Place 21 mg onto the skin daily.    Marland Kitchen umeclidinium bromide (INCRUSE ELLIPTA) 62.5 MCG/INH AEPB Inhale 1 puff into the lungs daily. 30 each 6  . nicotine polacrilex (NICOTINE MINI) 4 MG lozenge Use 4 per day (Patient not taking: Reported on 10/15/2017) 100 tablet 0   No facility-administered medications prior to visit.      Review of Systems  Constitutional: Negative for fever.  HENT: Negative for rhinorrhea and sore throat.   Respiratory: Positive for cough, shortness of breath and wheezing. Negative for hemoptysis and sputum production.   Cardiovascular: Positive for PND. Negative for chest pain, orthopnea and leg swelling.  Gastrointestinal: Negative for abdominal pain and vomiting.  Skin: Negative for rash.  Neurological: Positive for headaches.       Objective:   Physical Exam Vitals:   10/15/17 1005  BP: 122/84  Pulse: 77  Resp: 16  Temp: 98 F (36.7 C)  TempSrc: Oral  SpO2: 97%  Weight: 125 lb 6.4 oz (56.9 kg)  Height:  (1.753 m)    Gen: Pleasant, thin but muscular, in no distress,  normal affect  ENT: No lesions,  mouth clear,  oropharynx clear, no postnasal drip  Neck: No JVD, no TMG, no carotid bruits  Lungs: No use of accessory muscles, no dullness to percussion, distant BS, hyperresonance to percussion  Cardiovascular: RRR, heart sounds normal, no murmur or gallops, no peripheral edema  Abdomen: soft and NT, no HSM,  BS normal  Musculoskeletal: No deformities, no cyanosis or clubbing  Neuro: alert, non focal  Skin: Warm, no lesions or  rashes  No results found. ONO RA 09/2017: Desats to 83%   08/25/17 CXR: IMPRESSION: Severe COPD without acute abnormality.  All labs from 08/2017 hosp stay reviewed, no abn.        Assessment & Plan:  I personally reviewed all images and lab data in the [facility] system as well as any outside material available during this office visit and agree with the  radiology impressions.   COPD with chronic bronchitis (HCC) Copd needs PFTs.  Ongoing tobacco use Plan  tobacco counseling Cont incruse Prn SABA    Tobacco abuse Tobacco counseling advised  Chronic respiratory failure with hypoxia (HCC) Still with desat at night down to 83% RA  Stay on oxygen 2L qhs   Amara was seen today for follow-up and copd.  Diagnoses and all orders for this visit:  COPD with chronic bronchitis (HCC)  COPD exacerbation (HCC) -     Pulmonary Function Test; Future  Tobacco abuse  Chronic respiratory failure with hypoxia (HCC)    I had an extended discussion with the patient and or family lasting 10 minutes of a 25 minute visit including:  Smoking cessation

## 2017-11-07 MED FILL — !INCRUSE ELLIPTA 62.5 MCG I: 62.5 | 30 days supply | Qty: 30 | Fill #2

## 2017-11-12 ENCOUNTER — Ambulatory Visit (HOSPITAL_COMMUNITY)
Admission: RE | Admit: 2017-11-12 | Discharge: 2017-11-12 | Disposition: A | Discharge status: Home / Self Care | Payer: Self-pay | Source: Ambulatory Visit | Attending: Critical Care Medicine | Admitting: Critical Care Medicine

## 2017-11-12 DIAGNOSIS — J441 Chronic obstructive pulmonary disease with (acute) exacerbation: Secondary | ICD-10-CM

## 2017-11-12 DIAGNOSIS — J438 Other emphysema: Secondary | ICD-10-CM | POA: Insufficient documentation

## 2017-11-12 LAB — PULMONARY FUNCTION TEST
DL/VA % pred: 48 %
DL/VA: 2.21 ml/min/mmHg/L
DLCO UNC: 11.78 ml/min/mmHg
DLCO unc % pred: 38 %
FEF 25-75 Post: 0.4 L/sec
FEF 25-75 Pre: 0.49 L/sec
FEF2575-%Change-Post: -18 %
FEF2575-%Pred-Post: 11 %
FEF2575-%Pred-Pre: 13 %
FEV1-%CHANGE-POST: -1 %
FEV1-%PRED-PRE: 42 %
FEV1-%Pred-Post: 41 %
FEV1-POST: 1.66 L
FEV1-Pre: 1.67 L
FEV1FVC-%Change-Post: 4 %
FEV1FVC-%PRED-PRE: 53 %
FEV6-%Change-Post: -5 %
FEV6-%PRED-POST: 63 %
FEV6-%Pred-Pre: 66 %
FEV6-POST: 3.07 L
FEV6-PRE: 3.24 L
FEV6FVC-%CHANGE-POST: 0 %
FEV6FVC-%PRED-POST: 84 %
FEV6FVC-%PRED-PRE: 84 %
FVC-%Change-Post: -5 %
FVC-%Pred-Post: 74 %
FVC-%Pred-Pre: 78 %
FVC-Post: 3.75 L
FVC-Pre: 3.94 L
POST FEV6/FVC RATIO: 82 %
PRE FEV6/FVC RATIO: 82 %
Post FEV1/FVC ratio: 44 %
Pre FEV1/FVC ratio: 42 %
RV % PRED: 241 %
RV: 4.54 L
TLC % PRED: 127 %
TLC: 8.63 L

## 2017-11-12 MED ORDER — ALBUTEROL SULFATE (2.5 MG/3ML) 0.083% IN NEBU
2.5000 mg | INHALATION_SOLUTION | Freq: Once | RESPIRATORY_TRACT | Status: AC
  Administered 2017-11-12: 2.5 mg via RESPIRATORY_TRACT

## 2017-11-28 ENCOUNTER — Emergency Department (HOSPITAL_COMMUNITY)
Admission: EM | Admit: 2017-11-28 | Discharge: 2017-11-29 | Disposition: A | Discharge status: Home / Self Care | Payer: Medicaid Other | Attending: Emergency Medicine | Admitting: Emergency Medicine

## 2017-11-28 ENCOUNTER — Encounter (HOSPITAL_COMMUNITY): Payer: Self-pay | Admitting: Emergency Medicine

## 2017-11-28 ENCOUNTER — Other Ambulatory Visit: Payer: Self-pay

## 2017-11-28 DIAGNOSIS — Y999 Unspecified external cause status: Secondary | ICD-10-CM | POA: Diagnosis not present

## 2017-11-28 DIAGNOSIS — Y9383 Activity, rough housing and horseplay: Secondary | ICD-10-CM | POA: Insufficient documentation

## 2017-11-28 DIAGNOSIS — S20219A Contusion of unspecified front wall of thorax, initial encounter: Secondary | ICD-10-CM | POA: Diagnosis not present

## 2017-11-28 DIAGNOSIS — Z79899 Other long term (current) drug therapy: Secondary | ICD-10-CM | POA: Insufficient documentation

## 2017-11-28 DIAGNOSIS — Y929 Unspecified place or not applicable: Secondary | ICD-10-CM | POA: Diagnosis not present

## 2017-11-28 DIAGNOSIS — S299XXA Unspecified injury of thorax, initial encounter: Secondary | ICD-10-CM | POA: Diagnosis present

## 2017-11-28 DIAGNOSIS — J449 Chronic obstructive pulmonary disease, unspecified: Secondary | ICD-10-CM | POA: Diagnosis not present

## 2017-11-28 DIAGNOSIS — F17200 Nicotine dependence, unspecified, uncomplicated: Secondary | ICD-10-CM | POA: Insufficient documentation

## 2017-11-28 DIAGNOSIS — W500XXA Accidental hit or strike by another person, initial encounter: Secondary | ICD-10-CM | POA: Diagnosis not present

## 2017-11-28 NOTE — ED Triage Notes (Addendum)
Pt states he was wrestling 30 min ago and felt sternum "pop".  C/o pain to center of chest and sob.

## 2017-11-29 ENCOUNTER — Emergency Department (HOSPITAL_COMMUNITY): Payer: Medicaid Other

## 2017-11-29 MED ORDER — OXYCODONE HCL 5 MG PO TABS
5.0000 mg | ORAL_TABLET | Freq: Once | ORAL | Status: AC
  Administered 2017-11-29: 5 mg via ORAL
  Filled 2017-11-29: qty 1

## 2017-11-29 MED ORDER — DIAZEPAM 5 MG PO TABS
5.0000 mg | ORAL_TABLET | Freq: Once | ORAL | Status: AC
  Administered 2017-11-29: 5 mg via ORAL
  Filled 2017-11-29: qty 1

## 2017-11-29 MED ORDER — KETOROLAC TROMETHAMINE 60 MG/2ML IM SOLN
15.0000 mg | Freq: Once | INTRAMUSCULAR | Status: AC
  Administered 2017-11-29: 15 mg via INTRAMUSCULAR
  Filled 2017-11-29: qty 2

## 2017-11-29 MED ORDER — ACETAMINOPHEN 500 MG PO TABS
1000.0000 mg | ORAL_TABLET | Freq: Once | ORAL | Status: AC
  Administered 2017-11-29: 1000 mg via ORAL
  Filled 2017-11-29: qty 2

## 2017-11-29 NOTE — Discharge Instructions (Signed)
Take 4 over the counter ibuprofen tablets 3 times a day or 2 over-the-counter naproxen tablets twice a day for pain. Also take tylenol 1000mg(2 extra strength) four times a day.    
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
## 2017-12-04 ENCOUNTER — Telehealth: Payer: Self-pay | Admitting: *Deleted

## 2017-12-04 ENCOUNTER — Emergency Department (HOSPITAL_COMMUNITY)
Admission: EM | Admit: 2017-12-04 | Discharge: 2017-12-05 | Disposition: A | Discharge status: Other Acute Inpatient Hospital | Payer: Medicaid Other | Attending: Emergency Medicine | Admitting: Emergency Medicine

## 2017-12-04 ENCOUNTER — Other Ambulatory Visit: Payer: Self-pay

## 2017-12-04 ENCOUNTER — Encounter (HOSPITAL_COMMUNITY): Payer: Self-pay | Admitting: Emergency Medicine

## 2017-12-04 DIAGNOSIS — J449 Chronic obstructive pulmonary disease, unspecified: Secondary | ICD-10-CM | POA: Insufficient documentation

## 2017-12-04 DIAGNOSIS — F17200 Nicotine dependence, unspecified, uncomplicated: Secondary | ICD-10-CM | POA: Insufficient documentation

## 2017-12-04 DIAGNOSIS — F332 Major depressive disorder, recurrent severe without psychotic features: Secondary | ICD-10-CM | POA: Insufficient documentation

## 2017-12-04 DIAGNOSIS — Z79899 Other long term (current) drug therapy: Secondary | ICD-10-CM | POA: Insufficient documentation

## 2017-12-04 DIAGNOSIS — R45851 Suicidal ideations: Secondary | ICD-10-CM

## 2017-12-04 LAB — CBC
HEMATOCRIT: 48.1 % (ref 39.0–52.0)
Hemoglobin: 15.9 g/dL (ref 13.0–17.0)
MCH: 29.4 pg (ref 26.0–34.0)
MCHC: 33.1 g/dL (ref 30.0–36.0)
MCV: 89.1 fL (ref 78.0–100.0)
PLATELETS: 307 10*3/uL (ref 150–400)
RBC: 5.4 MIL/uL (ref 4.22–5.81)
RDW: 13.7 % (ref 11.5–15.5)
WBC: 7.3 10*3/uL (ref 4.0–10.5)

## 2017-12-04 LAB — COMPREHENSIVE METABOLIC PANEL
ALT: 15 U/L (ref 0–44)
ANION GAP: 9 (ref 5–15)
AST: 20 U/L (ref 15–41)
Albumin: 3.8 g/dL (ref 3.5–5.0)
Alkaline Phosphatase: 56 U/L (ref 38–126)
BILIRUBIN TOTAL: 0.5 mg/dL (ref 0.3–1.2)
BUN: 8 mg/dL (ref 6–20)
CO2: 24 mmol/L (ref 22–32)
Calcium: 8.8 mg/dL — ABNORMAL LOW (ref 8.9–10.3)
Chloride: 104 mmol/L (ref 98–111)
Creatinine, Ser: 1.08 mg/dL (ref 0.61–1.24)
GFR calc Af Amer: >60 mL/min (ref >60)
GFR calc non Af Amer: >60 mL/min (ref >60)
GLUCOSE: 158 mg/dL — AB (ref 70–99)
POTASSIUM: 3.8 mmol/L (ref 3.5–5.1)
Sodium: 137 mmol/L (ref 135–145)
TOTAL PROTEIN: 6 g/dL — AB (ref 6.5–8.1)

## 2017-12-04 LAB — RAPID URINE DRUG SCREEN, HOSP PERFORMED
Amphetamines: NOT DETECTED
BENZODIAZEPINES: NOT DETECTED
COCAINE: NOT DETECTED
OPIATES: NOT DETECTED
Tetrahydrocannabinol: NOT DETECTED

## 2017-12-04 LAB — SALICYLATE LEVEL: Salicylate Lvl: <7 mg/dL (ref 2.8–30.0)

## 2017-12-04 LAB — ACETAMINOPHEN LEVEL

## 2017-12-04 LAB — ETHANOL

## 2017-12-04 NOTE — BH Assessment (Signed)
[facility] after 2330 Accepting Nira ConnJason Berry, NP Attending Dr Jama Flavorsobos Number for report: [phone]

## 2017-12-04 NOTE — ED Notes (Signed)
TTS in process-Monique,RN  

## 2017-12-04 NOTE — BH Assessment (Signed)
Gave clinical report to Nira ConnJason Berry, NP who stated pt meets criteria for inpatient psychiatric treatment.  Shon BatonBrooks, [facility], if no appropriate beds TTS will seek placement.  Notified Azalia BilisKevin Campos, MD of recommendation.

## 2017-12-04 NOTE — ED Notes (Signed)
Security notified to wand pt. Pt retains glasses and wedding ring to left hand for belongings.

## 2017-12-04 NOTE — ED Provider Notes (Signed)
[facility] EMERGENCY DEPARTMENT Provider Note   CSN: [id number] Arrival date & time: 12/04/17  1603     History   Chief Complaint Chief Complaint  Patient presents with  . Suicidal    HPI Randall Barry is a 47 y.o. male.  HPI 47 yo male with a hx of COPD presenting with increased depression and suicidal thoughts. No hallucinations. No ETOH use. + tobacco. Hx of suicide attempts before. Not currently on mood stabilizing meds. No other complaints. Voluntary at this time    Past Medical History:  Diagnosis Date  . COPD (chronic obstructive pulmonary disease) (HCC)   . Hyperlipidemia     Patient Active Problem List   Diagnosis Date Noted  . Chronic respiratory failure with hypoxia (HCC) 09/10/2017  . Tobacco abuse 08/26/2017  . 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 FINGER SURGERY          Home Medications    Prior to Admission medications   Medication Sig Start Date End Date Taking? Authorizing Provider  albuterol (PROVENTIL HFA;VENTOLIN HFA) 108 (90 Base) MCG/ACT inhaler Inhale 2 puffs into the lungs every 6 (six) hours as needed for wheezing or shortness of breath. 08/27/17   Hongalgi, Maximino Greenland, MD  ibuprofen (ADVIL,MOTRIN) 200 MG tablet Take 200 mg by mouth every 6 (six) hours as needed.    [provider]  nicotine (NICODERM CQ - DOSED IN MG/24 HOURS) 21 mg/24hr patch Place 21 mg onto the skin daily.    [provider]  umeclidinium bromide (INCRUSE ELLIPTA) 62.5 MCG/INH AEPB Inhale 1 puff into the lungs daily. 09/10/17   Storm Frisk, MD    Family History No family history on file.  Social History Social History   Tobacco Use  . Smoking status: Current Every Day Smoker    Packs/day: 1.00  . Smokeless tobacco: Never Used  Substance Use Topics  . Alcohol use: Not Currently  . Drug use: Yes    Types: Marijuana    Comment: occ      Allergies   Patient has no known allergies.   Review of Systems Review of Systems  All other systems reviewed and are negative.    Physical Exam Updated Vital Signs BP (!) 142/96 (BP Location: Right Arm)   Pulse 78   Temp 97.9 F (36.6 C) (Oral)   Resp 16   SpO2 99%   Physical Exam  Constitutional: He is oriented to person, place, and time. He appears well-developed and well-nourished.  HENT:  Head: Normocephalic.  Eyes: EOM are normal.  Neck: Normal range of motion.  Cardiovascular: Normal rate.  Pulmonary/Chest: Effort normal.  Abdominal: He exhibits no distension.  Musculoskeletal: Normal range of motion.  Neurological: He is alert and oriented to person, place, and time.  Psychiatric:  Tearful, depression, SI  Nursing note and vitals reviewed.    ED Treatments / Results  Labs (all labs ordered are listed, but only abnormal results are displayed) Labs Reviewed  COMPREHENSIVE METABOLIC PANEL - Abnormal; Notable for the following components:      Result Value   Glucose, Bld 158 (*)    Calcium 8.8 (*)    Total Protein 6.0 (*)    All other components within normal limits  ACETAMINOPHEN LEVEL - Abnormal; Notable for the following components:   Acetaminophen (Tylenol), Serum <10 (*)    All other components within normal limits  ETHANOL  SALICYLATE LEVEL  CBC  RAPID URINE DRUG SCREEN, HOSP PERFORMED    EKG None  Radiology No results found.  Procedures Procedures (including critical care time)  Medications Ordered in ED Medications - No data to display   Initial Impression / Assessment and Plan / ED Course  I have reviewed the triage vital signs and the nursing notes.  Pertinent labs & imaging results that were available during my care of the patient were reviewed by me and considered in my medical decision making (see chart for details).     Medically clear. TTS to evaluate  Final Clinical Impressions(s) / ED Diagnoses   Final  diagnoses:  None    ED Discharge Orders    None       Azalia Bilisampos, Sadye Kiernan, MD 12/04/17 1759
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
## 2017-12-04 NOTE — ED Notes (Signed)
[facility] to [facility]; Dr.Campos notified of patient's request and impeding IVC at this time; [facility] and [facility] has been notified of change in patient status-Monique,RN

## 2017-12-04 NOTE — ED Notes (Signed)
[facility] [place]-Monique,RN

## 2017-12-04 NOTE — ED Notes (Signed)
Pt reports family took his belongings.

## 2017-12-04 NOTE — ED Triage Notes (Signed)
Pt presents with SI x 1 month. When asked if he has a plan, states that he has a gun at home. Family present and supportive.

## 2017-12-04 NOTE — ED Notes (Signed)
EDP at bedside; IVC paperwork has been given to Secretary to fax-Monique,RN

## 2017-12-05 ENCOUNTER — Encounter (HOSPITAL_COMMUNITY): Payer: Self-pay

## 2017-12-05 ENCOUNTER — Inpatient Hospital Stay (HOSPITAL_COMMUNITY)
Admission: AD | Admit: 2017-12-05 | Discharge: 2017-12-12 | DRG: 885 | Disposition: A | Discharge status: Home / Self Care | Payer: Federal, State, Local not specified - Other | Source: Intra-hospital | Attending: Psychiatry | Admitting: Psychiatry

## 2017-12-05 DIAGNOSIS — F17210 Nicotine dependence, cigarettes, uncomplicated: Secondary | ICD-10-CM | POA: Diagnosis present

## 2017-12-05 DIAGNOSIS — Z818 Family history of other mental and behavioral disorders: Secondary | ICD-10-CM | POA: Diagnosis not present

## 2017-12-05 DIAGNOSIS — Z791 Long term (current) use of non-steroidal anti-inflammatories (NSAID): Secondary | ICD-10-CM

## 2017-12-05 DIAGNOSIS — Z915 Personal history of self-harm: Secondary | ICD-10-CM

## 2017-12-05 DIAGNOSIS — F410 Panic disorder [episodic paroxysmal anxiety] without agoraphobia: Secondary | ICD-10-CM | POA: Diagnosis present

## 2017-12-05 DIAGNOSIS — J439 Emphysema, unspecified: Secondary | ICD-10-CM | POA: Diagnosis present

## 2017-12-05 DIAGNOSIS — R45851 Suicidal ideations: Secondary | ICD-10-CM | POA: Diagnosis present

## 2017-12-05 DIAGNOSIS — Z79899 Other long term (current) drug therapy: Secondary | ICD-10-CM | POA: Diagnosis not present

## 2017-12-05 DIAGNOSIS — Z560 Unemployment, unspecified: Secondary | ICD-10-CM | POA: Diagnosis not present

## 2017-12-05 DIAGNOSIS — I951 Orthostatic hypotension: Secondary | ICD-10-CM | POA: Diagnosis not present

## 2017-12-05 DIAGNOSIS — F909 Attention-deficit hyperactivity disorder, unspecified type: Secondary | ICD-10-CM | POA: Diagnosis present

## 2017-12-05 DIAGNOSIS — G4700 Insomnia, unspecified: Secondary | ICD-10-CM | POA: Diagnosis present

## 2017-12-05 DIAGNOSIS — F332 Major depressive disorder, recurrent severe without psychotic features: Principal | ICD-10-CM | POA: Diagnosis present

## 2017-12-05 DIAGNOSIS — E785 Hyperlipidemia, unspecified: Secondary | ICD-10-CM | POA: Diagnosis present

## 2017-12-05 MED ORDER — ACETAMINOPHEN 325 MG PO TABS: 650.0000 mg | ORAL_TABLET | Freq: Four times a day (QID) | ORAL | Status: DC | PRN

## 2017-12-05 MED ORDER — UMECLIDINIUM BROMIDE 62.5 MCG/INH IN AEPB
1.0000 | INHALATION_SPRAY | Freq: Every day | RESPIRATORY_TRACT | Status: DC
  Administered 2017-12-05 – 2017-12-11 (×7): 1 via RESPIRATORY_TRACT
  Filled 2017-12-05: qty 7

## 2017-12-05 MED ORDER — MAGNESIUM HYDROXIDE 400 MG/5ML PO SUSP
30.0000 mL | Freq: Every day | ORAL | Status: DC | PRN
  Administered 2017-12-07 – 2017-12-10 (×2): 30 mL via ORAL
  Filled 2017-12-05 (×2): qty 30

## 2017-12-05 MED ORDER — SERTRALINE HCL 25 MG PO TABS
25.0000 mg | ORAL_TABLET | Freq: Every day | ORAL | Status: DC
  Administered 2017-12-05 – 2017-12-06 (×2): 25 mg via ORAL
  Filled 2017-12-05 (×2): qty 1

## 2017-12-05 MED ORDER — HYDROXYZINE HCL 10 MG PO TABS
10.0000 mg | ORAL_TABLET | Freq: Three times a day (TID) | ORAL | Status: DC | PRN
  Administered 2017-12-08 – 2017-12-11 (×7): 10 mg via ORAL
  Filled 2017-12-05 (×3): qty 1
  Filled 2017-12-05: qty 10
  Filled 2017-12-05 (×5): qty 1

## 2017-12-05 MED ORDER — LOPERAMIDE HCL 2 MG PO CAPS
2.0000 mg | ORAL_CAPSULE | ORAL | Status: DC | PRN
  Filled 2017-12-05: qty 1

## 2017-12-05 MED ORDER — TRAZODONE HCL 50 MG PO TABS
50.0000 mg | ORAL_TABLET | Freq: Every evening | ORAL | Status: DC | PRN
Start: 2017-12-05 — End: 2017-12-05

## 2017-12-05 MED ORDER — SERTRALINE HCL 25 MG PO TABS
ORAL_TABLET | ORAL | Status: AC
  Filled 2017-12-05: qty 1

## 2017-12-05 MED ORDER — TRAZODONE HCL 50 MG PO TABS
25.0000 mg | ORAL_TABLET | Freq: Every evening | ORAL | Status: DC | PRN
  Administered 2017-12-05: 25 mg via ORAL
  Filled 2017-12-05 (×2): qty 1

## 2017-12-05 MED ORDER — ADULT MULTIVITAMIN W/MINERALS CH
1.0000 | ORAL_TABLET | Freq: Every day | ORAL | Status: DC
  Administered 2017-12-05 – 2017-12-12 (×8): 1 via ORAL
  Filled 2017-12-05 (×10): qty 1

## 2017-12-05 MED ORDER — BUPROPION HCL ER (XL) 150 MG PO TB24
ORAL_TABLET | ORAL | Status: AC
  Administered 2017-12-05: 150 mg via ORAL
  Filled 2017-12-05: qty 1

## 2017-12-05 MED ORDER — HYDROXYZINE HCL 25 MG PO TABS: 25.0000 mg | ORAL_TABLET | Freq: Three times a day (TID) | ORAL | Status: DC | PRN

## 2017-12-05 MED ORDER — IBUPROFEN 200 MG PO TABS
200.0000 mg | ORAL_TABLET | Freq: Four times a day (QID) | ORAL | Status: DC | PRN
  Administered 2017-12-05 – 2017-12-07 (×3): 200 mg via ORAL
  Filled 2017-12-05 (×3): qty 1

## 2017-12-05 MED ORDER — ALUM & MAG HYDROXIDE-SIMETH 200-200-20 MG/5ML PO SUSP: 30.0000 mL | ORAL | Status: DC | PRN

## 2017-12-05 MED ORDER — ENSURE ENLIVE PO LIQD
237.0000 mL | Freq: Two times a day (BID) | ORAL | Status: DC
  Administered 2017-12-05 – 2017-12-11 (×14): 237 mL via ORAL

## 2017-12-05 MED ORDER — BUPROPION HCL ER (XL) 150 MG PO TB24
150.0000 mg | ORAL_TABLET | Freq: Every day | ORAL | Status: DC
  Administered 2017-12-05 – 2017-12-09 (×6): 150 mg via ORAL
  Filled 2017-12-05 (×5): qty 1

## 2017-12-05 MED ORDER — ALBUTEROL SULFATE HFA 108 (90 BASE) MCG/ACT IN AERS
2.0000 | INHALATION_SPRAY | Freq: Four times a day (QID) | RESPIRATORY_TRACT | Status: DC | PRN
  Administered 2017-12-06 – 2017-12-12 (×8): 2 via RESPIRATORY_TRACT
  Filled 2017-12-05: qty 6.7

## 2017-12-05 NOTE — ED Notes (Signed)
[facility]Monique,[facility]

## 2017-12-05 NOTE — Tx Team (Signed)
Initial Treatment Plan 12/05/2017 4:16 AM Randall JobsAdrian R Tardiff ZOX:[id number]    PATIENT STRESSORS: Health problems Marital or family conflict Medication change or noncompliance   PATIENT STRENGTHS: Capable of independent living General fund of knowledge   PATIENT IDENTIFIED PROBLEMS: "I need help. I want to die."                     DISCHARGE CRITERIA:  Ability to meet basic life and health needs Adequate post-discharge living arrangements Improved stabilization in mood, thinking, and/or behavior Medical problems require only outpatient monitoring  PRELIMINARY DISCHARGE PLAN: Attend aftercare/continuing care group Return to previous living arrangement  PATIENT/FAMILY INVOLVEMENT: This treatment plan has been presented to and reviewed with the patient, Randall Barry.  The patient and family have been given the opportunity to ask questions and make suggestions.  Jonetta SpeakAshton E Jovon Winterhalter, RN 12/05/2017, 4:16 AM

## 2017-12-05 NOTE — Tx Team (Signed)
Interdisciplinary Treatment and Diagnostic Plan Update  12/05/2017 Time of Session: 9:30am Randall Barry MRN: [id number]  Principal Diagnosis: <principal problem not specified>  Secondary Diagnoses: Active Problems:   Severe recurrent major depression without psychotic features (HCC)   Current Medications:  Current Facility-Administered Medications  Medication Dose Route Frequency Provider Last Rate Last Dose  . albuterol (PROVENTIL HFA;VENTOLIN HFA) 108 (90 Base) MCG/ACT inhaler 2 puff  2 puff Inhalation Q6H PRN Lindon Romp A, NP      . alum & mag hydroxide-simeth (MAALOX/MYLANTA) 200-200-20 MG/5ML suspension 30 mL  30 mL Oral Q4H PRN Lindon Romp A, NP      . buPROPion (WELLBUTRIN XL) 24 hr tablet 150 mg  150 mg Oral Daily Cobos, Myer Peer, MD   150 mg at 12/05/17 1307  . feeding supplement (ENSURE ENLIVE) (ENSURE ENLIVE) liquid 237 mL  237 mL Oral BID BM Cobos, Myer Peer, MD   237 mL at 12/05/17 1102  . hydrOXYzine (ATARAX/VISTARIL) tablet 10 mg  10 mg Oral TID PRN Cobos, Myer Peer, MD      . ibuprofen (ADVIL,MOTRIN) tablet 200 mg  200 mg Oral Q6H PRN Lindon Romp A, NP   200 mg at 12/05/17 0345  . magnesium hydroxide (MILK OF MAGNESIA) suspension 30 mL  30 mL Oral Daily PRN Lindon Romp A, NP      . multivitamin with minerals tablet 1 tablet  1 tablet Oral Daily Cobos, Myer Peer, MD   1 tablet at 12/05/17 1137  . sertraline (ZOLOFT) 25 MG tablet           . sertraline (ZOLOFT) tablet 25 mg  25 mg Oral Daily Cobos, Myer Peer, MD   25 mg at 12/05/17 1207  . traZODone (DESYREL) tablet 25 mg  25 mg Oral QHS PRN Cobos, Fernando A, MD      . umeclidinium bromide (INCRUSE ELLIPTA) 62.5 MCG/INH 1 puff  1 puff Inhalation Daily Lindon Romp A, NP   1 puff at 12/05/17 1102   PTA Medications: Medications Prior to Admission  Medication Sig Dispense Refill Last Dose  . albuterol (PROVENTIL HFA;VENTOLIN HFA) 108 (90 Base) MCG/ACT inhaler Inhale 2 puffs into the lungs every 6 (six)  hours as needed for wheezing or shortness of breath. 1 Inhaler 0 Past Week at Unknown time  . ibuprofen (ADVIL,MOTRIN) 200 MG tablet Take 200 mg by mouth every 6 (six) hours as needed.   prn at unk  . umeclidinium bromide (INCRUSE ELLIPTA) 62.5 MCG/INH AEPB Inhale 1 puff into the lungs daily. 30 each 6 Past Week at Unknown time    Patient Stressors: Health problems Marital or family conflict Medication change or noncompliance  Patient Strengths: Capable of independent living General fund of knowledge  Treatment Modalities: Medication Management, Group therapy, Case management,  1 to 1 session with clinician, Psychoeducation, Recreational therapy.   Physician Treatment Plan for Primary Diagnosis: <principal problem not specified> Long Term Goal(s): Improvement in symptoms so as ready for discharge Improvement in symptoms so as ready for discharge   Short Term Goals: Ability to identify changes in lifestyle to reduce recurrence of condition will improve Ability to identify and develop effective coping behaviors will improve Ability to identify changes in lifestyle to reduce recurrence of condition will improve Ability to identify and develop effective coping behaviors will improve  Medication Management: Evaluate patient's response, side effects, and tolerance of medication regimen.  Therapeutic Interventions: 1 to 1 sessions, Unit Group sessions and Medication administration.  Evaluation of Outcomes:  Not Met  Physician Treatment Plan for Secondary Diagnosis: Active Problems:   Severe recurrent major depression without psychotic features ([place])  Long Term Goal(s): Improvement in symptoms so as ready for discharge Improvement in symptoms so as ready for discharge   Short Term Goals: Ability to identify changes in lifestyle to reduce recurrence of condition will improve Ability to identify and develop effective coping behaviors will improve Ability to identify changes in lifestyle  to reduce recurrence of condition will improve Ability to identify and develop effective coping behaviors will improve     Medication Management: Evaluate patient's response, side effects, and tolerance of medication regimen.  Therapeutic Interventions: 1 to 1 sessions, Unit Group sessions and Medication administration.  Evaluation of Outcomes: Not Met   RN Treatment Plan for Primary Diagnosis: <principal problem not specified> Long Term Goal(s): Knowledge of disease and therapeutic regimen to maintain health will improve  Short Term Goals: Ability to disclose and discuss suicidal ideas, Ability to identify and develop effective coping behaviors will improve and Compliance with prescribed medications will improve  Medication Management: RN will administer medications as ordered by provider, will assess and evaluate patient's response and provide education to patient for prescribed medication. RN will report any adverse and/or side effects to prescribing provider.  Therapeutic Interventions: 1 on 1 counseling sessions, Psychoeducation, Medication administration, Evaluate responses to treatment, Monitor vital signs and CBGs as ordered, Perform/monitor CIWA, COWS, AIMS and Fall Risk screenings as ordered, Perform wound care treatments as ordered.  Evaluation of Outcomes: Not Met   LCSW Treatment Plan for Primary Diagnosis: <principal problem not specified> Long Term Goal(s): Safe transition to appropriate next level of care at discharge, Engage patient in therapeutic group addressing interpersonal concerns.  Short Term Goals: Engage patient in aftercare planning with referrals and resources  Therapeutic Interventions: Assess for all discharge needs, 1 to 1 time with Social worker, Explore available resources and support systems, Assess for adequacy in community support network, Educate family and significant other(s) on suicide prevention, Complete Psychosocial Assessment, Interpersonal group  therapy.  Evaluation of Outcomes: Not Met   Progress in Treatment: Attending groups: No. Participating in groups: No. Taking medication as prescribed: Yes. Toleration medication: Yes. Family/Significant other contact made: No, will contact:  if patient consents Patient understands diagnosis: Yes. Discussing patient identified problems/goals with staff: Yes. Medical problems stabilized or resolved: Yes. Denies suicidal/homicidal ideation: Yes. Issues/concerns per patient self-inventory: No. Other:   New problem(s) identified: None   New Short Term/Long Term Goal(s): medication stabilization, elimination of SI thoughts, development of comprehensive mental wellness plan.    Patient Goals:  "I need help. I want to die."  Discharge Plan or Barriers: CSW will assess for appropriate referrals.   Reason for Continuation of Hospitalization: Depression Medication stabilization Suicidal ideation  Estimated Length of Stay: 3-5 days   Attendees: Patient: Randall Barry 12/05/2017 4:21 PM  Physician: Dr. Neita Garnet, MD 12/05/2017 4:21 PM  Nursing: Chong Sicilian.D, RN 12/05/2017 4:21 PM  RN Care Manager:X 12/05/2017 4:21 PM  Social Worker: Radonna Ricker, [place] 12/05/2017 4:21 PM  Recreational Therapist: X 12/05/2017 4:21 PM  Other: X 12/05/2017 4:21 PM  Other: X 12/05/2017 4:21 PM  Other:X 12/05/2017 4:21 PM    Scribe for Treatment Team: Marylee Floras, [place] 12/05/2017 4:21 PM

## 2017-12-05 NOTE — Progress Notes (Signed)
Recreation Therapy Notes  Date: 6.28.19 Time: 0930 Location: [address] Dayroom  Group Topic: Stress Management  Goal Area(s) Addresses:  Patient will verbalize importance of using healthy stress management.  Patient will identify positive emotions associated with healthy stress management.   Intervention: Stress Management  Activity :  Mountain Meditation.  LRT introduced the stress management technique of meditation.  LRT read Barry script on how mountains stay the same in spite of the things that go on around them.  Education:  Stress Management, Discharge Planning.   Education Outcome: Acknowledges edcuation/In group clarification offered/Needs additional education  Clinical Observations/Feedback: Pt did not attend group.    [name], LRT/CTRS         Randall Barry 12/05/2017 12:23 PM 

## 2017-12-05 NOTE — Progress Notes (Signed)
NUTRITION ASSESSMENT  Pt identified as at risk on the Malnutrition Screen Tool  INTERVENTION: Supplements:  - will order Ensure Enlive po BID, each supplement provides 350 kcal and 20 grams of protein - will order daily multivitamin with minerals.    NUTRITION DIAGNOSIS: Unintentional weight loss related to sub-optimal intake as evidenced by pt report.   Goal: Pt to meet >/= 90% of their estimated nutrition needs.  Monitor:  PO intake  Assessment:  Patient admitted for SI and depression. Per notes, patient reported hx of SI and depression and 5 suicide attempts in the past. He reports several associated symptoms including decreased sleep and eating.   Per chart review, patient has lost 7 lbs (5.6^ body weight) in the past 1.5-2 months. This is not significant for time frame, but is concerning given borderline underweight BMI. Continue to encourage PO intakes of meals and snacks. Will order supplements as outlined above.    47 y.o. male  Height: Ht Readings from Last 1 Encounters:  12/05/17 5' 6.54" (1.69 m)    Weight: Wt Readings from Last 1 Encounters:  12/05/17 118 lb (53.5 kg)    Weight Hx: Wt Readings from Last 10 Encounters:  12/05/17 118 lb (53.5 kg)  10/15/17 125 lb 6.4 oz (56.9 kg)  09/10/17 128 lb 6.4 oz (58.2 kg)  08/25/17 120 lb (54.4 kg)    BMI:  Body mass index is 18.74 kg/m. Pt meets criteria for normal weight/borderline underweight based on current BMI.  Estimated Nutritional Needs: Kcal: 25-30 kcal/kg Protein: > 1 gram protein/kg Fluid: 1 ml/kcal  Diet Order:  Diet Order           Diet regular Room service appropriate? Yes; Fluid consistency: Thin  Diet effective now         Pt is also offered choice of unit snacks mid-morning and mid-afternoon.  Pt is eating as desired.   Lab results and medications reviewed.     Trenton GammonJessica Kania Regnier, MS, RD, LDN, [facility] Inpatient Clinical Dietitian Pager # [phone] After hours/weekend pager #  [phone]

## 2017-12-05 NOTE — Plan of Care (Signed)
Patient verbalizes understanding of information, education provided. 
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
## 2017-12-05 NOTE — Progress Notes (Signed)
Patient presents with sad/depressed/sullen affect and behavior during admission interview and assessment. VS WDL monitored and recorded. 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 Contraband was not found. Patient was oriented to unit and schedule. Pt states "I need help. I want to die.". Pt denies SI/HI/AVH at this time. PO fluids provided. Safety maintained. Rest encouraged.

## 2017-12-05 NOTE — Plan of Care (Signed)
  Problem: Education: Goal: Emotional status will improve Outcome: Progressing   

## 2017-12-05 NOTE — H&P (Addendum)
Psychiatric Admission Assessment Adult  Patient Identification: Randall Barry MRN:  [id number] Date of Evaluation:  12/05/2017 Chief Complaint:  " I have been fighting this depression" Principal Diagnosis: MDD, no psychotic features  Diagnosis:   Patient Active Problem List   Diagnosis Date Noted  . Severe recurrent major depression without psychotic features ([place]) [F33.2] 12/05/2017  . Chronic respiratory failure with hypoxia ([place]) [J96.11] 09/10/2017  . Tobacco abuse [Z72.0] 08/26/2017  . COPD with chronic bronchitis ([place]) Gold C  [J44.9] 08/26/2017   History of Present Illness: Patient is a 47 year old married male, presented to ED voluntarily due to worsening depression. Attributes worsening depression due to being unemployed, having a chronic illness ( COPD) , being in a " toxic relationship " (with wife). States " it seems like I have no say in anything, I feel like nothing is mine, everything is hers ". Reports he has been having suicidal ideations, with thoughts of shooting self. Endorses neuro-vegetative symptoms of depression as below. He reports that about 5 days ago he overdosed on " some pills I bought" ( unsure what , thinks it might have been Ambien) but " nothing happened, I just slept for 12 hours ".  Denies psychotic symptoms.  Associated Signs/Symptoms: Depression Symptoms:  depressed mood, anhedonia, insomnia, suicidal thoughts with specific plan, anxiety, loss of energy/fatigue, decreased appetite,  Has lost about 10 lbs over recent weeks (Hypo) Manic Symptoms:  None noted or endorsed  Anxiety Symptoms:  Reports increased anxiety and worry recently  Psychotic Symptoms:  Denies  PTSD Symptoms: Does not endorse  Total Time spent with patient: 45 minutes  Past Psychiatric History: one prior psychiatric admission for depression back in 2014 for depression in the context of going through divorce at the time. Reports history of suicide attempt by overdosing in  2014. Denies history of self cutting. Denies history of mania or hypomania, denies history of psychosis, denies history of violence .  Is the patient at risk to self? Yes.    Has the patient been a risk to self in the past 6 months? Yes.    Has the patient been a risk to self within the distant past? Yes.    Is the patient a risk to others? No.  Has the patient been a risk to others in the past 6 months? No.  Has the patient been a risk to others within the distant past? No.   Prior Inpatient Therapy:   Prior Outpatient Therapy:    Alcohol Screening: 1. How often do you have a drink containing alcohol?: Monthly or less 2. How many drinks containing alcohol do you have on a typical day when you are drinking?: 1 or 2 3. How often do you have six or more drinks on one occasion?: Monthly AUDIT-C Score: 3 4. How often during the last year have you found that you were not able to stop drinking once you had started?: Never 5. How often during the last year have you failed to do what was normally expected from you becasue of drinking?: Never 6. How often during the last year have you needed a first drink in the morning to get yourself going after a heavy drinking session?: Never 7. How often during the last year have you had a feeling of guilt of remorse after drinking?: Never 8. How often during the last year have you been unable to remember what happened the night before because you had been drinking?: Never 9. Have you or someone  else been injured as a result of your drinking?: No 10. Has a relative or friend or a doctor or another health worker been concerned about your drinking or suggested you cut down?: No Alcohol Use Disorder Identification Test Final Score (AUDIT): 3 Intervention/Follow-up: Brief Advice, Alcohol Education Substance Abuse History in the last 12 months: no alcohol or drug abuse , reports he used to smoke cannabis regularly but now only occasionally  Consequences of  Substance Abuse: Denies  Previous Psychotropic Medications: had been treated with Xanax in the past for anxiety. Has been off it for few years . Psychological Evaluations:  No  Past Medical History:  Past Medical History:  Diagnosis Date  . COPD (chronic obstructive pulmonary disease) ([place])   . Hyperlipidemia     Past Surgical History:  Procedure Laterality Date  . FINGER SURGERY     Family History: parents alive, live together, has one sister Family Psychiatric  History: mother has history of depression, no suicides in family, maternal uncle alcoholic  Tobacco Screening: Smokes 2 PPD Social History: 47 year old married male, has 79 adult children, lives with wife , unemployed . Social History   Substance and Sexual Activity  Alcohol Use Not Currently     Social History   Substance and Sexual Activity  Drug Use Yes  . Types: Marijuana   Comment: occ    Additional Social History: Marital status: Married Number of Years Married: 1 What types of issues is patient dealing with in the relationship?: lots of conflict due to finances and "her disrespectful kids" Are you sexually active?: Yes What is your sexual orientation?: heterosexual Has your sexual activity been affected by drugs, alcohol, medication, or emotional stress?: significantly decreased Does patient have children?: Yes How many children?: 1 How is patient's relationship with their children?: son, age 4.  Good relationship.  Allergies:  No Known Allergies Lab Results:  Results for orders placed or performed during the hospital encounter of 12/04/17 (from the past 48 hour(s))  Comprehensive metabolic panel     Status: Abnormal   Collection Time: 12/04/17  4:16 PM  Result Value Ref Range   Sodium 137 135 - 145 mmol/L   Potassium 3.8 3.5 - 5.1 mmol/L   Chloride 104 98 - 111 mmol/L    Comment: Please note change in reference range.   CO2 24 22 - 32 mmol/L   Glucose, Bld 158 (H) 70 - 99 mg/dL    Comment: Please  note change in reference range.   BUN 8 6 - 20 mg/dL    Comment: Please note change in reference range.   Creatinine, Ser 1.08 0.61 - 1.24 mg/dL   Calcium 8.8 (L) 8.9 - 10.3 mg/dL   Total Protein 6.0 (L) 6.5 - 8.1 g/dL   Albumin 3.8 3.5 - 5.0 g/dL   AST 20 15 - 41 U/L   ALT 15 0 - 44 U/L    Comment: Please note change in reference range.   Alkaline Phosphatase 56 38 - 126 U/L   Total Bilirubin 0.5 0.3 - 1.2 mg/dL   GFR calc non Af Amer >60 >60 mL/min   GFR calc Af Amer >60 >60 mL/min    Comment: (NOTE) The eGFR has been calculated using the CKD EPI equation. This calculation has not been validated in all clinical situations. eGFR's persistently <60 mL/min signify possible Chronic Kidney Disease.    Anion gap 9 5 - 15    Comment: Performed at [facility]  [address], [place], [place] [address]  Ethanol     Status: None   Collection Time: 12/04/17  4:16 PM  Result Value Ref Range   Alcohol, Ethyl (B) <10 <10 mg/dL    Comment: (NOTE) Lowest detectable limit for serum alcohol is 10 mg/dL. For medical purposes only. Performed at [facility], [place] [address] [place], [place] [address]   Salicylate level     Status: None   Collection Time: 12/04/17  4:16 PM  Result Value Ref Range   Salicylate Lvl <9.6 2.8 - 30.0 mg/dL    Comment: Performed at [facility] [address] [place], [place] [address]  Acetaminophen level     Status: Abnormal   Collection Time: 12/04/17  4:16 PM  Result Value Ref Range   Acetaminophen (Tylenol), Serum <10 (L) 10 - 30 ug/mL    Comment: (NOTE) Therapeutic concentrations vary significantly. A range of 10-30 ug/mL  may be an effective concentration for many patients. However, some  are best treated at concentrations outside of this range. Acetaminophen concentrations >150 ug/mL at 4 hours after ingestion  and >50 ug/mL at 12 hours after ingestion are often associated with  toxic reactions. Performed at [facility], [place] [address], [place], [place] [address]   cbc     Status: None   Collection Time: 12/04/17  4:16 PM  Result Value Ref Range   WBC 7.3 4.0 - 10.5 K/uL   RBC 5.40 4.22 - 5.81 MIL/uL   Hemoglobin 15.9 13.0 - 17.0 g/dL   HCT 48.1 39.0 - 52.0 %   MCV 89.1 78.0 - 100.0 fL   MCH 29.4 26.0 - 34.0 pg   MCHC 33.1 30.0 - 36.0 g/dL   RDW 13.7 11.5 - 15.5 %   Platelets 307 150 - 400 K/uL    Comment: Performed at [facility] [address], [place], [place] [address]  Rapid urine drug screen (hospital performed)     Status: Abnormal   Collection Time: 12/04/17  6:40 PM  Result Value Ref Range   Opiates NONE DETECTED NONE DETECTED   Cocaine NONE DETECTED NONE DETECTED   Benzodiazepines NONE DETECTED NONE DETECTED   Amphetamines NONE DETECTED NONE DETECTED   Tetrahydrocannabinol NONE DETECTED NONE DETECTED   Barbiturates (A) NONE DETECTED    Result not available. Reagent lot number recalled by manufacturer.    Comment: Performed at [facility], [place] [address], [place], [place] [address]    Blood Alcohol level:  Lab Results  Component Value Date   ETH <10 93/90/3009    Metabolic Disorder Labs:  No results found for: HGBA1C, MPG No results found for: PROLACTIN No results found for: CHOL, TRIG, HDL, CHOLHDL, VLDL, LDLCALC  Current Medications: Current Facility-Administered Medications  Medication Dose Route Frequency Provider Last Rate Last Dose  . albuterol (PROVENTIL HFA;VENTOLIN HFA) 108 (90 Base) MCG/ACT inhaler 2 puff  2 puff Inhalation Q6H PRN Lindon Romp A, NP      . alum & mag hydroxide-simeth (MAALOX/MYLANTA) 200-200-20 MG/5ML suspension 30 mL  30 mL Oral Q4H PRN Lindon Romp A, NP      . feeding supplement (ENSURE ENLIVE) (ENSURE ENLIVE) liquid 237 mL  237 mL Oral BID BM Rihanna Marseille, Myer Peer, MD   237 mL at 12/05/17 1102  . hydrOXYzine (ATARAX/VISTARIL) tablet 25 mg  25 mg Oral TID PRN Lindon Romp A, NP      . ibuprofen (ADVIL,MOTRIN) tablet 200 mg  200 mg Oral  Q6H PRN Lindon Romp A, NP   200 mg at 12/05/17 0345  . magnesium hydroxide (MILK OF MAGNESIA) suspension 30 mL  30 mL Oral Daily PRN Lindon Romp A, NP      . multivitamin with minerals tablet 1 tablet  1 tablet Oral Daily Tahja Liao, Myer Peer, MD      . traZODone (DESYREL) tablet 50 mg  50 mg Oral QHS PRN Lindon Romp A, NP      . umeclidinium bromide (INCRUSE ELLIPTA) 62.5 MCG/INH 1 puff  1 puff Inhalation Daily Lindon Romp A, NP   1 puff at 12/05/17 1102   PTA Medications: Medications Prior to Admission  Medication Sig Dispense Refill Last Dose  . albuterol (PROVENTIL HFA;VENTOLIN HFA) 108 (90 Base) MCG/ACT inhaler Inhale 2 puffs into the lungs every 6 (six) hours as needed for wheezing or shortness of breath. 1 Inhaler 0 Past Week at Unknown time  . ibuprofen (ADVIL,MOTRIN) 200 MG tablet Take 200 mg by mouth every 6 (six) hours as needed.   prn at unk  . umeclidinium bromide (INCRUSE ELLIPTA) 62.5 MCG/INH AEPB Inhale 1 puff into the lungs daily. 30 each 6 Past Week at Unknown time    Musculoskeletal: Strength & Muscle Tone: within normal limits Gait & Station: normal Patient leans: N/A  Psychiatric Specialty Exam: Physical Exam  Review of Systems  Constitutional: Negative.   Eyes: Negative.   Respiratory: Positive for shortness of breath.        Reports shortness of breath on exertion, currently no shortness of breath at room air, SPO2 98%  Cardiovascular: Negative.   Gastrointestinal: Negative.   Genitourinary: Negative.   Musculoskeletal: Negative.   Skin: Negative.   Neurological: Positive for headaches. Negative for seizures.  Endo/Heme/Allergies: Negative.   Psychiatric/Behavioral: Positive for depression and suicidal ideas.  All other systems reviewed and are negative.   Blood pressure (!) 137/92, pulse 73, temperature 98.1 F (36.7 C), temperature source Oral, resp. rate 16, height 5' 6.54" (1.69 m), weight 53.5 kg (118 lb), SpO2 98 %.Body mass index is 18.74 kg/m.   General Appearance: Fairly Groomed  Eye Contact:  Fair  Speech:  Normal Rate  Volume:  Decreased  Mood:  Depressed  Affect:  Constricted  Thought Process:  Linear and Descriptions of Associations: Intact  Orientation:  Other:  fully alert and attentive  Thought Content:  no hallucinations, no delusions, not internally preoccupied   Suicidal Thoughts:  No denies current suicidal or self injurious ideations at this time and contracts for safety, denies any homicidal or violent ideations and specifically also denies any violent or homicidal ideations towards wife   Homicidal Thoughts:  No  Memory:  recent and remote grossly intact   Judgement:  Fair  Insight:  Fair  Psychomotor Activity:  Decreased  Concentration:  Concentration: Good and Attention Span: Good  Recall:  Good  Fund of Knowledge:  Good  Language:  Good  Akathisia:  Negative  Handed:  Right  AIMS (if indicated):     Assets:  Communication Skills Physical Health  ADL's:  Intact  Cognition:  WNL  Sleep:  Number of Hours: 1.25    Treatment Plan Summary: Daily contact with patient to assess and evaluate symptoms and progress in treatment, Medication management, Plan inpatient admission and medications as below  Observation Level/Precautions:  15 minute checks  Laboratory:  as needed   Psychotherapy:  Milieu, group therapy  Medications:  We discussed options - patient may benefit from  Wellbutrin for depression and for smoking cessation. He has no history of seizures .  Will start Wellbutrin XL 150 mgrs QAM Will also start Zoloft 25 mgrs QDAY initially   Consultations:  As needed  Discharge Concerns:  -   Estimated LOS: 5-6 days   Other:     Physician Treatment Plan for Primary Diagnosis: MDD, no Psychotic Features  Long Term Goal(s): Improvement in symptoms so as ready for discharge  Short Term Goals: Ability to identify changes in lifestyle to reduce recurrence of condition will improve and Ability to identify  and develop effective coping behaviors will improve  Physician Treatment Plan for Secondary Diagnosis: Suicidal Ideations Long Term Goal(s): Improvement in symptoms so as ready for discharge  Short Term Goals: Ability to identify changes in lifestyle to reduce recurrence of condition will improve and Ability to identify and develop effective coping behaviors will improve  I certify that inpatient services furnished can reasonably be expected to improve the patient's condition.    Jenne Campus, MD 6/28/201911:32 AM

## 2017-12-05 NOTE — [facility] Suicide Risk Assessment (Signed)
[facility]   Nursing information obtained from:  Patient Demographic factors:  Male, Caucasian, Low socioeconomic status, Access to firearms Current Mental Status:  Self-harm thoughts Loss Factors:  Financial problems / change in socioeconomic status Historical Factors:  Impulsivity Risk Reduction Factors:  NA  Total Time spent with patient: 45 minutes Principal Problem:  MDD Diagnosis:   Patient Active Problem List   Diagnosis Date Noted  . Severe recurrent major depression without psychotic features ([facility]) [F33.2] 12/05/2017  . Chronic respiratory failure with hypoxia ([facility]) [J96.11] 09/10/2017  . Tobacco abuse [Z72.0] 08/26/2017  . COPD with chronic bronchitis ([facility]) Gold C  [J44.9] 08/26/2017   Subjective Data:   Continued Clinical Symptoms:  Alcohol Use Disorder Identification Test Final Score (AUDIT): 3 The "Alcohol Use Disorders Identification Test", Guidelines for Use in Primary Care, Second Edition.  World Science writerHealth Organization [facility](WHO). Score between 0-7:  no or low risk or alcohol related problems. Score between 8-15:  moderate risk of alcohol related problems. Score between 16-19:  high risk of alcohol related problems. Score 20 or above:  warrants further diagnostic evaluation for alcohol dependence and treatment.   CLINICAL FACTORS:  47 year old male, history of depression, history of COPD, presents due to worsening depression and suicidal ideations in the context of significant stressors to include unemployment, marital stress, chronic medical illness.  Of note has continued to smoke up to 2 packs of cigarettes per day .    Psychiatric Specialty Exam: Physical Exam  ROS  Blood pressure (!) 137/92, pulse 73, temperature 98.1 F (36.7 C), temperature source Oral, resp. rate 16, height 5' 6.54" (1.69 m), weight 53.5 kg (118 lb), SpO2 98 %.Body mass index is 18.74 kg/m.   see admit note MSE   COGNITIVE FEATURES THAT CONTRIBUTE TO RISK:   Closed-mindedness and Loss of executive function    SUICIDE RISK:   Moderate:  Frequent suicidal ideation with limited intensity, and duration, some specificity in terms of plans, no associated intent, good self-control, limited dysphoria/symptomatology, some risk factors present, and identifiable protective factors, including available and accessible social support.  PLAN OF CARE: Patient will be admitted to inpatient psychiatric unit for stabilization and safety. Will provide and encourage milieu participation. Provide medication management and maked adjustments as needed.  Will follow daily.    I certify that inpatient services furnished can reasonably be expected to improve the patient's condition.   Craige CottaFernando A Cobos, MD 12/05/2017, 12:22 PM

## 2017-12-05 NOTE — Progress Notes (Signed)
D: Patient observed up and visible in the dayroom. Interacting with peers appropriately. Patient states "I am starting to relax some. I've met some good people here. I realize everyone is dealing with stuff. I'm far from alone." States he is not presently having SI and agrees to notify staff should he start to feel unsafe.  Patient's affect flat, sad, and anxious with congruent mood.   Denies pain, physical complaints.   A: No meds scheduled at this time. Offered trazadone prn which patient states he would like to take after the patients finish their movie. Medication education provided. Level III obs in place for safety. Emotional support offered. Patient encouraged to complete Suicide Safety Plan before discharge. Encouraged to attend and participate in unit programming.  R: Patient verbalizes understanding of POC. Patient denies SI/HI/AVH and remains safe on level III obs. Will continue to monitor throughout the night.

## 2017-12-05 NOTE — Progress Notes (Signed)
D Pt remains acutely sad, depressed and not in good shape.He has a grey ashen color. HE spends most of his day in his room- in his bed-; he says " I don't have any ewnergy..".    A He completed his daily assessment and on this he wrote he has experienced feelings of SI today but he verbally cotnracts with this nurse to not hurt himslef.  He  rated his depression, hopelessness and anxiety " 01/17/09", respectively. He does not attend his groups today,       R Safety is in place and poc cont.

## 2017-12-05 NOTE — Progress Notes (Signed)
The focus of this group is to help patients review their daily goal of treatment and discuss progress on daily workbooks. Pt attended the evening group session but responded minimally to discussion prompts from the Writer. Pt shared that today was a good day on the unit, the highlight of which was enjoying the company of his peers in the dayroom. "I've already made some good friends here."  When asked for one thing he would do differently upon discharge to stay well, Pt replied: "I don't know. You tell me. I'm still figuring it out."  Pt reported having no additional needs from Nursing Staff this evening and his affect was appropriate.

## 2017-12-06 LAB — TSH: TSH: 2.759 u[IU]/mL (ref 0.350–4.500)

## 2017-12-06 MED ORDER — TRAZODONE HCL 50 MG PO TABS
50.0000 mg | ORAL_TABLET | Freq: Every evening | ORAL | Status: DC | PRN
  Administered 2017-12-06 – 2017-12-07 (×2): 50 mg via ORAL
  Filled 2017-12-06 (×2): qty 1

## 2017-12-06 MED ORDER — SERTRALINE HCL 50 MG PO TABS
50.0000 mg | ORAL_TABLET | Freq: Every day | ORAL | Status: DC
  Administered 2017-12-07 – 2017-12-12 (×6): 50 mg via ORAL
  Filled 2017-12-06: qty 7
  Filled 2017-12-06: qty 1
  Filled 2017-12-06: qty 7
  Filled 2017-12-06 (×6): qty 1

## 2017-12-06 NOTE — Progress Notes (Signed)
Adult Psychoeducational Group Note  Date:  12/06/2017 Time:  11:11 AM  Group Topic/Focus:  Goals Group:   The focus of this group is to help patients establish daily goals to achieve during treatment and discuss how the patient can incorporate goal setting into their daily lives to aide in recovery.  Participation Level:  Active  Participation Quality:  Appropriate and Attentive  Affect:  Flat  Cognitive:  Alert and Appropriate  Insight: Good  Engagement in Group:  Engaged  Modes of Intervention:  Activity, Clarification, Discussion, Education and Support  Additional Comments: Pt completed the self-inventory and attended the goals/orietation group. He shared that he was divorcing his wife after 1 year of marriage describing the relationship as toxic.  Pt shared that he had medical issues and now is dealing with depression.  Pt has a plan to go to [place] after [place] and live with family until he finds a job.  Pt revealed that he is hurting in his chest and was encouraged to report this to his nurse.  Pt appeared to be receptive to treatment and willing to work on his stressors.    Randall Barry, Randall Barry  MHT/LRT/CTRS 12/06/2017, 11:11 AM

## 2017-12-06 NOTE — Progress Notes (Signed)
D. Pt presents with a sad affect but improving mood today.  Pt observed interacting well with peers in the dayroom for the majority of the shift. Pt rates his depression, hopelessness and anxiety a 3/3/6, respectively. Pt writes that his most important goal today is "getting out of here".Pt currently denies SI/HI and AV hallucinations. A. Labs and vitals monitored. Pt compliant with medications. Pt supported emotionally and encouraged to express concerns and ask questions.   R. Pt remains safe with 15 minute checks. Will continue POC.

## 2017-12-06 NOTE — Progress Notes (Signed)
[facility] MD Progress Note  12/06/2017 11:18 AM MOUHAMAD TEED  MRN:  [id number] Subjective: Patient remains depressed but acknowledges he feels better than he did prior to admission.  He states that interacting with other patients/peers in the dayroom has helped him feel better.  Today denies suicidal ideations.  Thus far denies medication side effects. Objective : I have reviewed the chart notes and have met with patient. 47 year old married male, presented due to worsening depression, suicidal ideations.  Described chronic stressors to include marital distancing and discord and chronic medical illness (COPD). Today patient presents less depressed than on admission, affect remains constricted but is more reactive and he did smile at times appropriately during session.  Denies suicidal ideations at this time.  Reports sleep was fair, appetite fair. He has been visible on unit and noted to be interacting with peers.  States he has benefited from groups and interactions/milieu. Denies medication side effects. Denies suicidal ideations at this time.  Presents future oriented and states that his plan is to have his vehicle fixed and then move out of his house/go live with a friend. Labs reviewed- TSH WNL. Principal Problem:  MDD Diagnosis:   Patient Active Problem List   Diagnosis Date Noted  . Severe recurrent major depression without psychotic features ([place]) [F33.2] 12/05/2017  . Chronic respiratory failure with hypoxia ([place]) [J96.11] 09/10/2017  . Tobacco abuse [Z72.0] 08/26/2017  . 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 COPD (chronic obstructive pulmonary disease) ([place])   . Hyperlipidemia     Past Surgical History:  Procedure Laterality Date  . FINGER SURGERY     Family History: History reviewed. No pertinent family  history. Family Psychiatric  History:  Social History:  Social History   Substance and Sexual Activity  Alcohol Use Not Currently     Social History   Substance and Sexual Activity  Drug Use Yes  . Types: Marijuana   Comment: occ    Social History   Socioeconomic History  . Marital status: Married    Spouse name: Not on file  . Number of children: Not on file  . Years of education: Not on file  . Highest education level: Not on file  Occupational History  . Not on file  Social Needs  . Financial resource strain: Not on file  . Food insecurity:    Worry: Not on file    Inability: Not on file  . Transportation needs:    Medical: Not on file    Non-medical: Not on file  Tobacco Use  . Smoking status: Current Every Day Smoker    Packs/day: 2.00  . Smokeless tobacco: Never Used  Substance and Sexual Activity  . Alcohol use: Not Currently  . Drug use: Yes    Types: Marijuana    Comment: occ  . Sexual activity: Not Currently    Birth control/protection: None  Lifestyle  . Physical activity:    Days per week: Not on file    Minutes per session: Not on file  . Stress: Not on file  Relationships  . Social connections:    Talks on phone: Not on file    Gets together: Not on file    Attends religious service: Not on file    Active member of club or organization: Not on file    Attends  meetings of clubs or organizations: Not on file    Relationship status: Not on file  Other Topics Concern  . Not on file  Social History Narrative  . Not on file   Additional Social History:   Sleep: Fair but improving  Appetite:  Fair  Current Medications: Current Facility-Administered Medications  Medication Dose Route Frequency Provider Last Rate Last Dose  . albuterol (PROVENTIL HFA;VENTOLIN HFA) 108 (90 Base) MCG/ACT inhaler 2 puff  2 puff Inhalation Q6H PRN Lindon Romp A, NP   2 puff at 12/06/17 0827  . alum & mag hydroxide-simeth (MAALOX/MYLANTA) 200-200-20 MG/5ML  suspension 30 mL  30 mL Oral Q4H PRN Lindon Romp A, NP      . buPROPion (WELLBUTRIN XL) 24 hr tablet 150 mg  150 mg Oral Daily Deasia Chiu, Myer Peer, MD   150 mg at 12/06/17 0825  . feeding supplement (ENSURE ENLIVE) (ENSURE ENLIVE) liquid 237 mL  237 mL Oral BID BM Pinchos Topel, Myer Peer, MD   237 mL at 12/06/17 1008  . hydrOXYzine (ATARAX/VISTARIL) tablet 10 mg  10 mg Oral TID PRN Maecy Podgurski, Myer Peer, MD      . ibuprofen (ADVIL,MOTRIN) tablet 200 mg  200 mg Oral Q6H PRN Lindon Romp A, NP   200 mg at 12/05/17 0345  . loperamide (IMODIUM) capsule 2 mg  2 mg Oral PRN Money, Lowry Ram, FNP      . magnesium hydroxide (MILK OF MAGNESIA) suspension 30 mL  30 mL Oral Daily PRN Lindon Romp A, NP      . multivitamin with minerals tablet 1 tablet  1 tablet Oral Daily Dontavious Emily, Myer Peer, MD   1 tablet at 12/06/17 0825  . sertraline (ZOLOFT) tablet 25 mg  25 mg Oral Daily Bobby Ragan, Myer Peer, MD   25 mg at 12/06/17 0825  . traZODone (DESYREL) tablet 25 mg  25 mg Oral QHS PRN Felix Meras, Myer Peer, MD   25 mg at 12/05/17 2228  . umeclidinium bromide (INCRUSE ELLIPTA) 62.5 MCG/INH 1 puff  1 puff Inhalation Daily Lindon Romp A, NP   1 puff at 12/06/17 0825    Lab Results:  Results for orders placed or performed during the hospital encounter of 12/05/17 (from the past 48 hour(s))  TSH     Status: None   Collection Time: 12/06/17  6:14 AM  Result Value Ref Range   TSH 2.759 0.350 - 4.500 uIU/mL    Comment: Performed by a 3rd Generation assay with a functional sensitivity of <=0.01 uIU/mL. Performed at [facility], [place] [address] [place],  [address]     Blood Alcohol level:  Lab Results  Component Value Date   ETH <10 62/83/6629    Metabolic Disorder Labs: No results found for: HGBA1C, MPG No results found for: PROLACTIN No results found for: CHOL, TRIG, HDL, CHOLHDL, VLDL, LDLCALC  Physical Findings: AIMS:  , ,  ,  ,    CIWA:    COWS:     Musculoskeletal: Strength & Muscle Tone:  within normal limits Gait & Station: normal Patient leans: N/A  Psychiatric Specialty Exam: Physical Exam  ROS-no headache, no chest pain, at this time no shortness of breath at room air, does report dyspnea on exertion related to COPD, reports diarrhea yesterday but not today, no vomiting, no fever.  Blood pressure 110/86, pulse 82, temperature 98.2 F (36.8 C), resp. rate 16, height 5' 6.54" (1.69 m), weight 53.5 kg (118 lb), SpO2 98 %.Body mass index is 18.74  kg/m.  General Appearance: Fairly groomed  Eye Contact:  Good  Speech:  Normal Rate  Volume:  Normal  Mood:  Mood reported as partially improved , presents less depressed  Affect:  Less constricted, but still sad.  Smiles briefly at times during session  Thought Process:  Linear and Descriptions of Associations: Intact  Orientation:  Full (Time, Place, and Person)  Thought Content:  No hallucinations, no delusions, not internally preoccupied  Suicidal Thoughts:  No-denies suicidal ideations at this time, contracts for safety on unit, denies homicidal or violent ideations  Homicidal Thoughts:  No  Memory:  Recent and remote grossly intact  Judgement:  Other:  Improving  Insight:  Improving  Psychomotor Activity:  Normal  Concentration:  Concentration: Good and Attention Span: Good  Recall:  Good  Fund of Knowledge:  Good  Language:  Good  Akathisia:  Negative  Handed:  Right  AIMS (if indicated):     Assets:  Desire for Improvement Resilience  ADL's:  Intact  Cognition:  WNL  Sleep:  Number of Hours: 1.25   Assessment -47 year old male, history of depression, presented for worsening depression and suicidal ideations.  History of COPD.  Mood improving compared to admission, affect becoming more reactive, visible and participative in milieu.  Tolerating medications (Wellbutrin, Zoloft) well thus far.  Currently denies suicidal ideations  Treatment Plan Summary: Daily contact with patient to assess and evaluate symptoms  and progress in treatment, Medication management, Plan Inpatient treatment and Medications as below Encourage group and milieu participation to work on coping skills and symptom reduction Continue Wellbutrin XL 150 mg a day for depression Increase Zoloft to 50 mg daily for depression and anxiety Continue Trazodone 25 mg nightly PRN for insomnia as needed Continue Vistaril 10 mg 3 times a day as needed for anxiety Treatment team working on disposition planning options Jenne Campus, MD 12/06/2017, 11:18 AM

## 2017-12-06 NOTE — [facility] Group Notes (Signed)
LCSW Group Therapy Note  12/06/2017   10:00--11:00am   Type of Therapy and Topic:  Group Therapy: Anger Cues and Responses  Participation Level:  Active   Description of Group:   In this group, patients learned how to recognize the physical, cognitive, emotional, and behavioral responses they have to anger-provoking situations.  They identified a recent time they became angry and how they reacted.  They analyzed how their reaction was possibly beneficial and how it was possibly unhelpful.  The group discussed a variety of healthier coping skills that could help with such a situation in the future.  Deep breathing was practiced briefly.  Therapeutic Goals: 1. Patients will remember their last incident of anger and how they felt emotionally and physically, what their thoughts were at the time, and how they behaved. 2. Patients will identify how their behavior at that time worked for them, as well as how it worked against them. 3. Patients will explore possible new behaviors to use in future anger situations. 4. Patients will learn that anger itself is normal and cannot be eliminated, and that healthier reactions can assist with resolving conflict rather than worsening situations.  Summary of Patient Progress:  The patient shared that his most recent time of anger was during an argument with his wife and said "he lost  It" when she swept everything from a table .Patient reviewed the circumstance of this last incident of anger and expressed recognition this his reaction was counter-productive and that it did not resolve the situation. Patient is open to adding new behaviors to use in the future. Patient understands that anger is a normal human emotion.  Therapeutic Modalities:   Cognitive Behavioral Therapy  Henrene Dodgeonnie Adelfo Diebel, LCSW  Evorn Gongonnie D [facility]

## 2017-12-06 NOTE — Progress Notes (Signed)
D.  Pt pleasant on approach, complaint of insomnia.  Pt requested increase in sleep medication.  Pt was positive for evening wrap up group, observed engaged in appropriate interaction with peers on the unit.  Pt denies SI/HI/AVH at this time.  A.  Support and encouragement offered, medication given as ordered.  Spoke with PA and received order for increase in Trazodone.  R. Pt remains safe on the unit, will continue to monitor.

## 2017-12-07 MED ORDER — TRAZODONE HCL 50 MG PO TABS
50.0000 mg | ORAL_TABLET | Freq: Every evening | ORAL | Status: DC | PRN
  Administered 2017-12-07: 50 mg via ORAL
  Filled 2017-12-07: qty 1

## 2017-12-07 NOTE — [facility] Group Notes (Signed)
[facility]  Date/Time:  12/07/2017  10:00-11:00AM  Type of Therapy and Topic:  Group Therapy:  Healthy and Unhealthy Supports  Participation Level:  Active   Description of Group:  Patients in this group were introduced to the idea of adding a variety of healthy supports to address the various needs in their lives.Patients discussed what additional healthy supports could be helpful in their recovery and wellness after discharge in order to prevent future hospitalizations.   An emphasis was placed on using counselor, doctor, therapy groups, 12-step groups, and problem-specific support groups to expand supports.  They also worked as a group on developing a specific plan for several patients to deal with unhealthy supports through boundary-setting, psychoeducation with loved ones, and even termination of relationships.   Therapeutic Goals:   1)  discuss importance of adding supports to stay well once out of the hospital  2)  compare healthy versus unhealthy supports and identify some examples of each  3)  generate ideas and descriptions of healthy supports that can be added  4)  offer mutual support about how to address unhealthy supports  5)  encourage active participation in and adherence to discharge plan    Summary of Patient Progress:  The patient stated that current healthy supports in his  life are his father and his son. Patient is able to articulate the importance of having healthy supports in their life to help in the recovery process. Patient is able to discern healthy supports from unhealthy ones.     Therapeutic Modalities:   Motivational Interviewing Brief Solution-Focused Therapy  Henrene Dodgeonnie Chassie Pennix, LCSW  Evorn Gongonnie D Devaun Hernandez

## 2017-12-07 NOTE — Progress Notes (Signed)
D. Pt presents with an appropriate affect and improving mood- observed interacting well with peers in dayroom and attending groups. Pt reports having slept poorly again last night and endorses neck pain, 4/10. Per pt's self inventory, pt rates his depression, hopelessness and anxiety a 2/2/7, respectively. Pt writes that his most important goal today is "getting well" and "group time" will help him meet his goal. Pt currently denies SI/HI and AV hallucinations. A. Labs and vitals monitored. Pt given scheduled meds and prn med for pain. Pt supported emotionally and encouraged to express concerns and ask questions.   R. Pt remains safe with 15 minute checks. Will continue POC.

## 2017-12-07 NOTE — [facility] Group Notes (Signed)
Adult Psychoeducational Group Note  Date:  12/07/2017 Time:  4:40 AM  Group Topic/Focus:  Wrap-Up Group:   The focus of this group is to help patients review their daily goal of treatment and discuss progress on daily workbooks.  Participation Level:  Active  Participation Quality:  Appropriate  Affect:  Appropriate  Cognitive:  Appropriate  Insight: Appropriate  Engagement in Group:  Engaged  Modes of Intervention:  Discussion  Additional Comments:  Patient engaged in group discussion regarding anger management plans. 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 Patient was used as an example of good ways to cope with anger.   Lyndee HensenGoins, Makiya Jeune R 12/07/2017, 4:40 AM

## 2017-12-07 NOTE — Progress Notes (Signed)
[facility] MD Progress Note  12/07/2017 10:05 AM FIRAS GUARDADO  MRN:  [id number] Subjective:  Patient describes partially improved mood compared to admission . He denies suicidal ideations. He denies medication side effects. Describes lingering insomnia.  States he was visited by his wife yesterday and that visit was cordial, but expresses some reservations about improvement of marital stressors. States " I think once I get back home it will go back to the same old thing ". Remains future oriented and states he plans to work on fixing his truck , which will then give him the mobility and independence to move to a friend's house . Objective : I have reviewed the chart notes and have met with patient. 47 year old married male, presented due to worsening depression, suicidal ideations.  Described chronic stressors to include marital distancing and discord and chronic medical illness (COPD). Patient is presenting with partially improved mood and a more reactive/fuller range of affect. Still depressed, but acknowledges he feels better and affect is more reactive . Visible on unit, interactive with peers, pleasant on approach,behavior on unit calm and in good control. Denies medication side effects at this time. Denies suicidal ideations. He has been visible on unit and noted to be interacting with peers.  States he has benefited from groups and interactions/milieu.  Principal Problem:  MDD Diagnosis:   Patient Active Problem List   Diagnosis Date Noted  . Severe recurrent major depression without psychotic features ([place]) [F33.2] 12/05/2017  . Chronic respiratory failure with hypoxia ([place]) [J96.11] 09/10/2017  . Tobacco abuse [Z72.0] 08/26/2017  . COPD with chronic bronchitis ([place]) Gold C  [J44.9] 08/26/2017   Total Time spent with patient: 20 minutes  Past Psychiatric History:  Past Medical History:  Past Medical History:  Diagnosis Date  . COPD (chronic obstructive pulmonary disease) ([place])    . Hyperlipidemia     Past Surgical History:  Procedure Laterality Date  . FINGER SURGERY     Family History: History reviewed. No pertinent family history. Family Psychiatric  History:  Social History:  Social History   Substance and Sexual Activity  Alcohol Use Not Currently     Social History   Substance and Sexual Activity  Drug Use Yes  . Types: Marijuana   Comment: occ    Social History   Socioeconomic History  . Marital status: Married    Spouse name: Not on file  . Number of children: Not on file  . Years of education: Not on file  . Highest education level: Not on file  Occupational History  . Not on file  Social Needs  . Financial resource strain: Not on file  . Food insecurity:    Worry: Not on file    Inability: Not on file  . Transportation needs:    Medical: Not on file    Non-medical: Not on file  Tobacco Use  . Smoking status: Current Every Day Smoker    Packs/day: 2.00  . Smokeless tobacco: Never Used  Substance and Sexual Activity  . Alcohol use: Not Currently  . Drug use: Yes    Types: Marijuana    Comment: occ  . Sexual activity: Not Currently    Birth control/protection: None  Lifestyle  . Physical activity:    Days per week: Not on file    Minutes per session: Not on file  . Stress: Not on file  Relationships  . Social connections:    Talks on phone: Not on file    Gets together:  Not on file    Attends religious service: Not on file    Active member of club or organization: Not on file    Attends meetings of clubs or organizations: Not on file    Relationship status: Not on file  Other Topics Concern  . Not on file  Social History Narrative  . Not on file   Additional Social History:   Sleep: Fair  Appetite:  Good  Current Medications: Current Facility-Administered Medications  Medication Dose Route Frequency Provider Last Rate Last Dose  . albuterol (PROVENTIL HFA;VENTOLIN HFA) 108 (90 Base) MCG/ACT inhaler 2 puff  2  puff Inhalation Q6H PRN Lindon Romp A, NP   2 puff at 12/06/17 1713  . alum & mag hydroxide-simeth (MAALOX/MYLANTA) 200-200-20 MG/5ML suspension 30 mL  30 mL Oral Q4H PRN Lindon Romp A, NP      . buPROPion (WELLBUTRIN XL) 24 hr tablet 150 mg  150 mg Oral Daily Jonel Sick, Myer Peer, MD   150 mg at 12/07/17 0807  . feeding supplement (ENSURE ENLIVE) (ENSURE ENLIVE) liquid 237 mL  237 mL Oral BID BM Nguyen Todorov, Myer Peer, MD   237 mL at 12/07/17 0854  . hydrOXYzine (ATARAX/VISTARIL) tablet 10 mg  10 mg Oral TID PRN Corneilus Heggie, Myer Peer, MD      . ibuprofen (ADVIL,MOTRIN) tablet 200 mg  200 mg Oral Q6H PRN Lindon Romp A, NP   200 mg at 12/07/17 0811  . loperamide (IMODIUM) capsule 2 mg  2 mg Oral PRN Money, Lowry Ram, FNP      . magnesium hydroxide (MILK OF MAGNESIA) suspension 30 mL  30 mL Oral Daily PRN Lindon Romp A, NP      . multivitamin with minerals tablet 1 tablet  1 tablet Oral Daily Teondre Jarosz, Myer Peer, MD   1 tablet at 12/07/17 0808  . sertraline (ZOLOFT) tablet 50 mg  50 mg Oral Daily Jazir Newey, Myer Peer, MD   50 mg at 12/07/17 0807  . traZODone (DESYREL) tablet 50 mg  50 mg Oral QHS PRN Laverle Hobby, PA-C   50 mg at 12/06/17 2156  . umeclidinium bromide (INCRUSE ELLIPTA) 62.5 MCG/INH 1 puff  1 puff Inhalation Daily Lindon Romp A, NP   1 puff at 12/07/17 0240    Lab Results:  Results for orders placed or performed during the hospital encounter of 12/05/17 (from the past 48 hour(s))  TSH     Status: None   Collection Time: 12/06/17  6:14 AM  Result Value Ref Range   TSH 2.759 0.350 - 4.500 uIU/mL    Comment: Performed by a 3rd Generation assay with a functional sensitivity of <=0.01 uIU/mL. Performed at [facility], [place] [address] [place], [place] [address]     Blood Alcohol level:  Lab Results  Component Value Date   ETH <10 29/92/4268    Metabolic Disorder Labs: No results found for: HGBA1C, MPG No results found for: PROLACTIN No results found for: CHOL,  TRIG, HDL, CHOLHDL, VLDL, LDLCALC  Physical Findings: AIMS: Facial and Oral Movements Muscles of Facial Expression: None, normal Lips and Perioral Area: None, normal Jaw: None, normal Tongue: None, normal,Extremity Movements Upper (arms, wrists, hands, fingers): None, normal Lower (legs, knees, ankles, toes): None, normal, Trunk Movements Neck, shoulders, hips: None, normal, Overall Severity Severity of abnormal movements (highest score from questions above): None, normal Incapacitation due to abnormal movements: None, normal Patient's awareness of abnormal movements (rate only patient's report): No Awareness, Dental Status Current problems  with teeth and/or dentures?: No Does patient usually wear dentures?: No  CIWA:    COWS:     Musculoskeletal: Strength & Muscle Tone: within normal limits Gait & Station: normal Patient leans: N/A  Psychiatric Specialty Exam: Physical Exam  ROS-no headache, no chest pain, at this time no shortness of breath at room air, does report dyspnea on exertion related to COPD, reports diarrhea yesterday but not today, no vomiting, no fever.  Blood pressure 96/82, pulse 80, temperature 98.2 F (36.8 C), resp. rate 16, height 5' 6.54" (1.69 m), weight 53.5 kg (118 lb), SpO2 98 %.Body mass index is 18.74 kg/m.  General Appearance: improving grooming   Eye Contact:  Good  Speech:  Normal Rate  Volume:  Normal  Mood:  improving , less depressed   Affect:  becoming more reactive, fuller in range   Thought Process:  Linear and Descriptions of Associations: Intact  Orientation:  Full (Time, Place, and Person)  Thought Content:  No hallucinations, no delusions, not internally preoccupied  Suicidal Thoughts:  No-denies suicidal ideations at this time, contracts for safety on unit, denies homicidal or violent ideations  Homicidal Thoughts:  No  Memory:  Recent and remote grossly intact  Judgement:  Other:  Improving  Insight:  Improving  Psychomotor  Activity:  Normal  Concentration:  Concentration: Good and Attention Span: Good  Recall:  Good  Fund of Knowledge:  Good  Language:  Good  Akathisia:  Negative  Handed:  Right  AIMS (if indicated):     Assets:  Desire for Improvement Resilience  ADL's:  Intact  Cognition:  WNL  Sleep:     Assessment -patient describes partial improvement but still feels depressed- is presenting with gradually improving mood and fuller range of affect . Denies SI at this time. Tolerating medications well thus far .History of COPD, but not presenting with dyspnea at room air and Pulse Ox readings have been WNL.  Sleep partially improved but still fair . We discussed advantages and issues regarding smoking cessation. Denies cravings for nicotine at this time. Have discussed options such as patch or gum/replacement therapy, but states he has not found these helpful in the past . Aware that Wellbutrin may help with smoking cessation and encouraged to maintain focus and motivation towards quitting .     Treatment Plan Summary: Daily contact with patient to assess and evaluate symptoms and progress in treatment, Medication management, Plan Inpatient treatment and Medications as below  Treatment Plan reviewed as below today 6/30 Continue to support, encourage smoking cessation efforts  Encourage group and milieu participation to work on coping skills and symptom reduction Continue Wellbutrin XL 150 mg a day for depression Continue  Zoloft 50 mg daily for depression and anxiety Increase Trazodone to 50  mg nightly PRN for insomnia as needed Continue Vistaril 10 mg 3 times a day as needed for anxiety Treatment team working on disposition planning options Jenne Campus, MD 12/07/2017, 10:05 AM   Patient ID: Evans Lance, male   DOB: 1970/07/07, 47 y.o.   MRN: [id number]

## 2017-12-07 NOTE — [facility] Suicide Risk Assessment (Signed)
[facility] INPATIENT:  Family/Significant Other Suicide Prevention Education  Suicide Prevention Education:  Education Completed; Randall Barry, father,  (name of family member/significant other) has been identified by the patient as the family member/significant other with whom the patient will be residing, and identified as the person(s) who will aid the patient in the event of a mental health crisis (suicidal ideations/suicide attempt).  With written consent from the patient, the family member/significant other has been provided the following suicide prevention education, prior to the and/or following the discharge of the patient.  The suicide prevention education provided includes the following:  Suicide risk factors  Suicide prevention and interventions  National Suicide Hotline telephone number  [facility] assessment telephone number  [facility] Emergency Assistance 911  [facility] and/or Residential Mobile Crisis Unit telephone number  Request made of family/significant other to:  Remove weapons (e.g., guns, rifles, knives), all items previously/currently identified as safety concern.    Remove drugs/medications (over-the-counter, prescriptions, illicit drugs), all items previously/currently identified as a safety concern.  The family member/significant other verbalizes understanding of the suicide prevention education information provided.  The family member/significant other agrees to remove the items of safety concern listed above. Randall Barry's response was "right" to all the suicide prevention information. Randall Barry reports he talked to him a little bit ago and "he says he is feeling better but stressed about going home". Randall Barry reports this happened one other time after he split with his first wife and I think this still has a lot to do with that.   Randall Barry 12/07/2017, 3:15 PM

## 2017-12-07 NOTE — [facility] Group Notes (Signed)
[facility] Group Notes:  (Nursing)  Date:  12/07/2017  Time:    Type of Therapy:  Nurse Education  Participation Level:  Active  Participation Quality:  Appropriate  Affect:  Appropriate  Cognitive:  Appropriate  Insight:  Appropriate  Engagement in Group:  Engaged  Modes of Intervention:  Discussion, Education and Exploration  Summary of Progress/Problems: Group played a non competitive learning/communication board game that fosters listening skills as well as self expression  Shela NevinValerie S Sricharan Lacomb 12/07/2017, 3:53 PM

## 2017-12-08 MED ORDER — TRAZODONE HCL 100 MG PO TABS
100.0000 mg | ORAL_TABLET | Freq: Every evening | ORAL | Status: DC | PRN
  Administered 2017-12-08 – 2017-12-11 (×5): 100 mg via ORAL
  Filled 2017-12-08 (×5): qty 1

## 2017-12-08 NOTE — Progress Notes (Signed)
Recreation Therapy Notes  Date: 7.1.19 Time: 0930 Location: [address] Dayroom  Group Topic: Stress Management  Goal Area(s) Addresses:  Patient will verbalize importance of using healthy stress management.  Patient will identify positive emotions associated with healthy stress management.   Behavioral Response: Engaged  Intervention: Stress Management  Activity :  Progressive Muscle Relaxation.  LRT introduced the stress management technique of progressive muscle relaxation.  LRT read script to guide patients through tensing and relaxing each muscle individually.  Patients were to follow along as LRT lead them through the exercise.  Education:  Stress Management, Discharge Planning.   Education Outcome: Acknowledges edcuation/In group clarification offered/Needs additional education  Clinical Observations/Feedback: Pt attended and participated in activity.     Caroll RancherMarjette Adeyemi [place], LRT/CTRS         Caroll RancherLindsay, Avary Pitsenbarger A 12/08/2017 11:41 AM

## 2017-12-08 NOTE — Progress Notes (Signed)
[facility] MD Progress Note  12/08/2017 11:24 AM CACE OSORTO  MRN:  [id number] Subjective: Patient is seen and examined.  Patient is a 47 year old male with a past psychiatric history significant for major depression who is seen in follow-up.  He was admitted on 6/28 with suicidal ideation.  The patient stated his outside pressures included recently being diagnosed with COPD/emphysema and an inability to work.  He also stated he was unable to cope with the stress at home with his wife and his wife's family members.  He stated he had just gotten married in August, but knew that this was "toxic relationship".  He stated they gave him a hard time and thought he was "faking" about his lung issues and inability to work.  He stated he felt better since admission.  He stated that his suicidal ideation had decreased.  He stated he was unsure whether it was the medication, or just having the support of other patients.  He denied any current suicidal ideation.  He denied any side effects to his current medications.  We discussed how is he going to cope with the stressors at home once he is released from the hospital.  He stated his goal was to find a place to live independently from his family after he is discharged. Principal Problem: <principal problem not specified> Diagnosis:   Patient Active Problem List   Diagnosis Date Noted  . Severe recurrent major depression without psychotic features ([facility]) [F33.2] 12/05/2017  . Chronic respiratory failure with hypoxia ([facility]) [J96.11] 09/10/2017  . Tobacco abuse [Z72.0] 08/26/2017  . COPD with chronic bronchitis ([facility]) Gold C  [J44.9] 08/26/2017   Total Time spent with patient: 30 minutes  Past Psychiatric History: See admission H&P  Past Medical History:  Past Medical History:  Diagnosis Date  . COPD (chronic obstructive pulmonary disease) ([facility])   . Hyperlipidemia     Past Surgical History:  Procedure Laterality Date  . FINGER SURGERY     Family History:  History reviewed. No pertinent family history. Family Psychiatric  History: See admission H&P Social History:  Social History   Substance and Sexual Activity  Alcohol Use Not Currently     Social History   Substance and Sexual Activity  Drug Use Yes  . Types: Marijuana   Comment: occ    Social History   Socioeconomic History  . Marital status: Married    Spouse name: Not on file  . Number of children: Not on file  . Years of education: Not on file  . Highest education level: Not on file  Occupational History  . Not on file  Social Needs  . Financial resource strain: Not on file  . Food insecurity:    Worry: Not on file    Inability: Not on file  . Transportation needs:    Medical: Not on file    Non-medical: Not on file  Tobacco Use  . Smoking status: Current Every Day Smoker    Packs/day: 2.00  . Smokeless tobacco: Never Used  Substance and Sexual Activity  . Alcohol use: Not Currently  . Drug use: Yes    Types: Marijuana    Comment: occ  . Sexual activity: Not Currently    Birth control/protection: None  Lifestyle  . Physical activity:    Days per week: Not on file    Minutes per session: Not on file  . Stress: Not on file  Relationships  . Social connections:    Talks on phone: Not on  file    Gets together: Not on file    Attends religious service: Not on file    Active member of club or organization: Not on file    Attends meetings of clubs or organizations: Not on file    Relationship status: Not on file  Other Topics Concern  . Not on file  Social History Narrative  . Not on file   Additional Social History:                         Sleep: Fair  Appetite:  Fair  Current Medications: Current Facility-Administered Medications  Medication Dose Route Frequency Provider Last Rate Last Dose  . albuterol (PROVENTIL HFA;VENTOLIN HFA) 108 (90 Base) MCG/ACT inhaler 2 puff  2 puff Inhalation Q6H PRN Nira ConnBerry, Jason A, NP   2 puff at 12/06/17  1713  . alum & mag hydroxide-simeth (MAALOX/MYLANTA) 200-200-20 MG/5ML suspension 30 mL  30 mL Oral Q4H PRN Nira ConnBerry, Jason A, NP      . buPROPion (WELLBUTRIN XL) 24 hr tablet 150 mg  150 mg Oral Daily Cobos, Rockey SituFernando A, MD   150 mg at 12/08/17 0748  . feeding supplement (ENSURE ENLIVE) (ENSURE ENLIVE) liquid 237 mL  237 mL Oral BID BM Cobos, Fernando A, MD   237 mL at 12/08/17 0925  . hydrOXYzine (ATARAX/VISTARIL) tablet 10 mg  10 mg Oral TID PRN Cobos, Rockey SituFernando A, MD      . ibuprofen (ADVIL,MOTRIN) tablet 200 mg  200 mg Oral Q6H PRN Nira ConnBerry, Jason A, NP   200 mg at 12/07/17 0811  . loperamide (IMODIUM) capsule 2 mg  2 mg Oral PRN Money, Feliz Beamravis B, FNP      . magnesium hydroxide (MILK OF MAGNESIA) suspension 30 mL  30 mL Oral Daily PRN Nira ConnBerry, Jason A, NP   30 mL at 12/07/17 1656  . multivitamin with minerals tablet 1 tablet  1 tablet Oral Daily Cobos, Rockey SituFernando A, MD   1 tablet at 12/08/17 0748  . sertraline (ZOLOFT) tablet 50 mg  50 mg Oral Daily Cobos, Rockey SituFernando A, MD   50 mg at 12/08/17 0748  . traZODone (DESYREL) tablet 50 mg  50 mg Oral QHS PRN,MR X 1 Donell SievertSimon, Spencer E, PA-C   50 mg at 12/07/17 2258  . umeclidinium bromide (INCRUSE ELLIPTA) 62.5 MCG/INH 1 puff  1 puff Inhalation Daily Nira ConnBerry, Jason A, NP   1 puff at 12/08/17 0747    Lab Results: No results found for this or any previous visit (from the past 48 hour(s)).  Blood Alcohol level:  Lab Results  Component Value Date   ETH <10 12/04/2017    Metabolic Disorder Labs: No results found for: HGBA1C, MPG No results found for: PROLACTIN No results found for: CHOL, TRIG, HDL, CHOLHDL, VLDL, LDLCALC  Physical Findings: AIMS: Facial and Oral Movements Muscles of Facial Expression: None, normal Lips and Perioral Area: None, normal Jaw: None, normal Tongue: None, normal,Extremity Movements Upper (arms, wrists, hands, fingers): None, normal Lower (legs, knees, ankles, toes): None, normal, Trunk Movements Neck, shoulders, hips: None,  normal, Overall Severity Severity of abnormal movements (highest score from questions above): None, normal Incapacitation due to abnormal movements: None, normal Patient's awareness of abnormal movements (rate only patient's report): No Awareness, Dental Status Current problems with teeth and/or dentures?: No Does patient usually wear dentures?: No  CIWA:    COWS:     Musculoskeletal: Strength & Muscle Tone: within normal limits Gait & Station: normal  Patient leans: N/A  Psychiatric Specialty Exam: Physical Exam  Nursing note and vitals reviewed. Constitutional: He is oriented to person, place, and time. He appears well-developed and well-nourished.  HENT:  Head: Normocephalic and atraumatic.  Respiratory: Effort normal.  Neurological: He is alert and oriented to person, place, and time.    ROS  Blood pressure 96/82, pulse 80, temperature 98.2 F (36.8 C), resp. rate 16, height 5' 6.54" (1.69 m), weight 53.5 kg (118 lb), SpO2 98 %.Body mass index is 18.74 kg/m.  General Appearance: Casual  Eye Contact:  Fair  Speech:  Normal Rate  Volume:  Normal  Mood:  Anxious  Affect:  Congruent  Thought Process:  Coherent  Orientation:  Full (Time, Place, and Person)  Thought Content:  Logical  Suicidal Thoughts:  No  Homicidal Thoughts:  No  Memory:  Immediate;   Fair Recent;   Fair Remote;   Fair  Judgement:  Intact  Insight:  Fair  Psychomotor Activity:  Increased  Concentration:  Concentration: Fair and Attention Span: Fair  Recall:  [employer] of Knowledge:  Fair  Language:  Fair  Akathisia:  Negative  Handed:  Right  AIMS (if indicated):     Assets:  Communication Skills Desire for Improvement Resilience Talents/Skills  ADL's:  Intact  Cognition:  WNL  Sleep:  Number of Hours: 4.75     Treatment Plan Summary: Daily contact with patient to assess and evaluate symptoms and progress in treatment, Medication management and Plan Patient is seen and examined.  Patient  is a 47 year old male with the above-stated past psychiatric history seen in follow-up.  He continues on Zoloft 50 mg p.o. daily, trazodone 50 mg p.o. nightly, as well as Wellbutrin XL 150 mg p.o. daily.  His COPD symptoms are stable, and I am not going to make any changes in his medications today.  Hopefully he will continue to improve.  We discussed working on coping skills in dealing with his stressors at home after discharge while he is looking for another place to live.  Antonieta Pert, MD 12/08/2017, 11:24 AM

## 2017-12-08 NOTE — Plan of Care (Signed)
  Problem: Education: Goal: Emotional status will improve Outcome: Progressing Goal: Mental status will improve Outcome: Progressing   Problem: Activity: Goal: Interest or engagement in activities will improve Outcome: Progressing Goal: Sleeping patterns will improve Outcome: Progressing   Problem: Coping: Goal: Ability to verbalize frustrations and anger appropriately will improve Outcome: Progressing Goal: Ability to demonstrate self-control will improve Outcome: Progressing   Problem: Health Behavior/Discharge Planning: Goal: Identification of resources available to assist in meeting health care needs will improve Outcome: Progressing Goal: Compliance with treatment plan for underlying cause of condition will improve Outcome: Progressing   Problem: Physical Regulation: Goal: Ability to maintain clinical measurements within normal limits will improve Outcome: Progressing   Problem: Safety: Goal: Periods of time without injury will increase Outcome: Progressing   Problem: Coping: Goal: Coping ability will improve Outcome: Progressing   Problem: Safety: Goal: Ability to identify and utilize support systems that promote safety will improve Outcome: Progressing   Problem: Medication: Goal: Compliance with prescribed medication regimen will improve Outcome: Progressing   Problem: Self-Concept: Goal: Will verbalize positive feelings about self Outcome: Progressing

## 2017-12-08 NOTE — Progress Notes (Signed)
Patient self inventory- Patient slept poorly last night, sleep medication was requested but was not helpful (MD notified), appetite has been fair, energy level low, and concentration good. Depression is rated 0, hopelessness 2, and anxiety 2 out of 10 with 10 being the highest. Denies withdrawal symptoms, denies SI HI AVH. Endorses physical pain rated 4 out of 10 in his chest and neck. Pain medication was not requested. Patient's goal is "staying positive by trying to stay happy."  Patient compliant with medication administration. Safety maintained with 15 minute checks. Will continue to monitor.

## 2017-12-08 NOTE — Progress Notes (Signed)
Pt attend wrap up group. He was a little late attending. Pt said this is the worst he has felt since he been her. He was not having a good day. Pt said I was rude when I came to tell him visitation was over. He did not have to be remind:00ed visitation was over. I explain to the patient it was 8:08 pm and I was explaining it was over at 8:00pm. I explain to the patient and his family I was not being rude. I explained in a whisper voice the time was after 8:00pm. There was an over head anouncment made to inform all patients visitation over. Then the patient said he may be at fault he was mad at his daughter. One of the other patients said she heard the conversation and the tech was not rude to the patients and their families. RN notified.

## 2017-12-08 NOTE — Progress Notes (Signed)
D.  Pt pleasant on approach, continues to complaint of insomnia.  Pt was positive for evening wrap up group, observed interacting appropriately with peers on the unit.  Pt denies SI/HI/AVH at this time.  A.  Spoke with PA and received order for repeat on sleep medication, support and encouragement offered to patient.  R.  Pt remains safe on the unit, will continue to monitor.

## 2017-12-08 NOTE — Progress Notes (Signed)
Wrier spoke with patient 1:1 in his room. Writer inquired as to why he was not attending wrap up group and he c/o of tonight feeling the worse he has since being here. He reported feeling anxious and not able to sleep the past 2 nights. He brought to writers attention that he sleeps with oxygen at night at home because his oxygen tends to drop in the 80's when sleeping. This information was brought to the attention of the NP J.Berry and AC T. Beck.An order was received for him to have oxygen at hs. He was also informed of his trazodone increase and was relieved and hopeful to get some rest tonight. He did attend the latter part of group after we talked. Support given and safety maintained on unit with 15 min checks.

## 2017-12-09 DIAGNOSIS — R45851 Suicidal ideations: Secondary | ICD-10-CM

## 2017-12-09 DIAGNOSIS — Z818 Family history of other mental and behavioral disorders: Secondary | ICD-10-CM

## 2017-12-09 MED ORDER — BUPROPION HCL ER (XL) 300 MG PO TB24
300.0000 mg | ORAL_TABLET | Freq: Every day | ORAL | Status: DC
  Administered 2017-12-10 – 2017-12-12 (×3): 300 mg via ORAL
  Filled 2017-12-09: qty 7
  Filled 2017-12-09 (×5): qty 1
  Filled 2017-12-09: qty 7

## 2017-12-09 MED ORDER — GABAPENTIN 100 MG PO CAPS
100.0000 mg | ORAL_CAPSULE | Freq: Three times a day (TID) | ORAL | Status: DC
  Administered 2017-12-09 – 2017-12-12 (×11): 100 mg via ORAL
  Filled 2017-12-09: qty 21
  Filled 2017-12-09 (×2): qty 1
  Filled 2017-12-09: qty 21
  Filled 2017-12-09: qty 1
  Filled 2017-12-09: qty 21
  Filled 2017-12-09 (×5): qty 1
  Filled 2017-12-09: qty 21
  Filled 2017-12-09 (×2): qty 1
  Filled 2017-12-09: qty 21
  Filled 2017-12-09 (×2): qty 1
  Filled 2017-12-09: qty 21
  Filled 2017-12-09 (×2): qty 1

## 2017-12-09 NOTE — Progress Notes (Signed)
[facility]  12/09/2017 8:46 AM Randall Barry  MRN:  [id number] Subjective: Patient is seen and examined.  Patient is a 47 year old male with past psychiatric history significant for major depression who is seen in follow-up.  He had a rough day yesterday.  He had a meeting with the peers support group, and describes a panic attack that he had afterwards.  We discussed that this a.m.  He feels trapped in his current living situation.  He feels like it is a toxic environment.  We discussed this and how he should concentrate on coping skills in the group treatment here.  We discussed changes in his medications today.  He denied any current suicidal ideation.  He still has trepidation about when he will be discharged and return to that current home situation. Principal Problem: <principal problem not specified> Diagnosis:   Patient Active Problem List   Diagnosis Date Noted  . Severe recurrent major depression without psychotic features (HCC) [F33.2] 12/05/2017  . Chronic respiratory failure with hypoxia (HCC) [J96.11] 09/10/2017  . Tobacco abuse [Z72.0] 08/26/2017  . 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 COPD (chronic obstructive pulmonary disease) (HCC)   . Hyperlipidemia     Past Surgical History:  Procedure Laterality Date  . FINGER SURGERY     Family History: History reviewed. No pertinent family history. Family Psychiatric  History: See admission H&P Social History:  Social History   Substance and Sexual Activity  Alcohol Use Not Currently     Social History   Substance and Sexual Activity  Drug Use Yes  . Types: Marijuana   Comment: occ    Social History   Socioeconomic History  . Marital status: Married    Spouse name: Not on file  . Number of children: Not on file  . Years  of education: Not on file  . Highest education level: Not on file  Occupational History  . Not on file  Social Needs  . Financial resource strain: Not on file  . Food insecurity:    Worry: Not on file    Inability: Not on file  . Transportation needs:    Medical: Not on file    Non-medical: Not on file  Tobacco Use  . Smoking status: Current Every Day Smoker    Packs/day: 2.00  . Smokeless tobacco: Never Used  Substance and Sexual Activity  . Alcohol use: Not Currently  . Drug use: Yes    Types: Marijuana    Comment: occ  . Sexual activity: Not Currently    Birth control/protection: None  Lifestyle  . Physical activity:    Days per week: Not on file    Minutes per session: Not on file  . Stress: Not on file  Relationships  . Social connections:    Talks on phone: Not on file    Gets together: Not on file    Attends religious service: Not on file    Active member of club or organization: Not on file    Attends meetings of clubs or organizations: Not on file    Relationship status: Not on file  Other Topics Concern  . Not on file  Social History Narrative  . Not on file   Additional Social History:  Sleep: Fair  Appetite:  Fair  Current Medications: Current Facility-Administered Medications  Medication Dose Route Frequency Provider Last Rate Last Dose  . albuterol (PROVENTIL HFA;VENTOLIN HFA) 108 (90 Base) MCG/ACT inhaler 2 puff  2 puff Inhalation Q6H PRN Nira Conn A, NP   2 puff at 12/08/17 1346  . alum & mag hydroxide-simeth (MAALOX/MYLANTA) 200-200-20 MG/5ML suspension 30 mL  30 mL Oral Q4H PRN Jackelyn Poling, NP      . Melene Muller ON 12/10/2017] buPROPion (WELLBUTRIN XL) 24 hr tablet 300 mg  300 mg Oral Daily Antonieta Pert, MD      . feeding supplement (ENSURE ENLIVE) (ENSURE ENLIVE) liquid 237 mL  237 mL Oral BID BM Cobos, Rockey Situ, MD   237 mL at 12/08/17 1345  . gabapentin (NEURONTIN) capsule 100 mg  100 mg Oral TID  Antonieta Pert, MD      . hydrOXYzine (ATARAX/VISTARIL) tablet 10 mg  10 mg Oral TID PRN Cobos, Rockey Situ, MD   10 mg at 12/09/17 0831  . ibuprofen (ADVIL,MOTRIN) tablet 200 mg  200 mg Oral Q6H PRN Nira Conn A, NP   200 mg at 12/07/17 0811  . loperamide (IMODIUM) capsule 2 mg  2 mg Oral PRN Money, Feliz Beam B, FNP      . magnesium hydroxide (MILK OF MAGNESIA) suspension 30 mL  30 mL Oral Daily PRN Nira Conn A, NP   30 mL at 12/07/17 1656  . multivitamin with minerals tablet 1 tablet  1 tablet Oral Daily Cobos, Rockey Situ, MD   1 tablet at 12/09/17 0829  . sertraline (ZOLOFT) tablet 50 mg  50 mg Oral Daily Cobos, Rockey Situ, MD   50 mg at 12/09/17 0829  . traZODone (DESYREL) tablet 100 mg  100 mg Oral QHS PRN,MR X 1 Antonieta Pert, MD   100 mg at 12/08/17 2201  . umeclidinium bromide (INCRUSE ELLIPTA) 62.5 MCG/INH 1 puff  1 puff Inhalation Daily Nira Conn A, NP   1 puff at 12/09/17 0865    Lab Results: No results found for this or any previous visit (from the past 48 hour(s)).  Blood Alcohol level:  Lab Results  Component Value Date   ETH <10 12/04/2017    Metabolic Disorder Labs: No results found for: HGBA1C, MPG No results found for: PROLACTIN No results found for: CHOL, TRIG, HDL, CHOLHDL, VLDL, LDLCALC  Physical Findings: AIMS: Facial and Oral Movements Muscles of Facial Expression: None, normal Lips and Perioral Area: None, normal Jaw: None, normal Tongue: None, normal,Extremity Movements Upper (arms, wrists, hands, fingers): None, normal Lower (legs, knees, ankles, toes): None, normal, Trunk Movements Neck, shoulders, hips: None, normal, Overall Severity Severity of abnormal movements (highest score from questions above): None, normal Incapacitation due to abnormal movements: None, normal Patient's awareness of abnormal movements (rate only patient's report): No Awareness, Dental Status Current problems with teeth and/or dentures?: No Does patient usually wear  dentures?: No  CIWA:    COWS:     Musculoskeletal: Strength & Muscle Tone: within normal limits Gait & Station: normal Patient leans: N/A  Psychiatric Specialty Exam: Physical Exam  Nursing note and vitals reviewed. Constitutional: He is oriented to person, place, and time. He appears well-developed and well-nourished.  HENT:  Head: Normocephalic and atraumatic.  Respiratory: Effort normal.  Neurological: He is alert and oriented to person, place, and time.    ROS  Blood pressure 102/80, pulse 97, temperature 97.7 F (36.5 C), temperature source Oral, resp. rate 16, height  5' 6.54" (1.69 m), weight 53.5 kg (118 lb), SpO2 98 %.Body mass index is 18.74 kg/m.  General Appearance: Casual  Eye Contact:  Fair  Speech:  Normal Rate  Volume:  Decreased  Mood:  Depressed  Affect:  Congruent  Thought Process:  Coherent  Orientation:  Full (Time, Place, and Person)  Thought Content:  Logical  Suicidal Thoughts:  No  Homicidal Thoughts:  No  Memory:  Immediate;   Fair Recent;   Fair Remote;   Fair  Judgement:  Intact  Insight:  Lacking  Psychomotor Activity:  Increased  Concentration:  Concentration: Fair and Attention Span: Fair  Recall:  [employer] of Knowledge:  Fair  Language:  Fair  Akathisia:  Negative  Handed:  Right  AIMS (if indicated):     Assets:  Desire for Improvement Housing Resilience  ADL's:  Intact  Cognition:  WNL  Sleep:  Number of Hours: 5.75     Treatment Plan Summary: Daily contact with patient to assess and evaluate symptoms and progress in treatment, Medication management and Plan Patient is seen and examined.  Patient is a 47 year old male with the above-stated past psychiatric history seen in follow-up.  He complains of increased anxiety and no relief from his depression at this point.  I am going to increase his Wellbutrin XL to 300 mg p.o. daily.  Additionally I am going to add gabapentin 100 mg p.o. 3 times daily for anxiety.  He can continue  on the hydroxyzine, and I am not going to change his Zoloft today.  I have discussed with him to work on his coping skills during groups today so he can deal with the fact that he has to return to an environment that he does not want to.  Hopefully these changes will be beneficial to him.  Antonieta Pert, MD 12/09/2017, 8:46 AM

## 2017-12-09 NOTE — [facility] Group Notes (Signed)
LCSW Group Therapy Note 12/09/2017 3:27 PM  Type of Therapy and Topic: Group Therapy: Overcoming Obstacles  Participation Level: Active  Description of Group:  In this group patients will be encouraged to explore what they see as obstacles to their own wellness and recovery. They will be guided to discuss their thoughts, feelings, and behaviors related to these obstacles. The group will process together ways to cope with barriers, with attention given to specific choices patients can make. Each patient will be challenged to identify changes they are motivated to make in order to overcome their obstacles. This group will be process-oriented, with patients participating in exploration of their own experiences as well as giving and receiving support and challenge from other group members.  Therapeutic Goals: 1. Patient will identify personal and current obstacles as they relate to admission. 2. Patient will identify barriers that currently interfere with their wellness or overcoming obstacles.  3. Patient will identify feelings, thought process and behaviors related to these barriers. 4. Patient will identify two changes they are willing to make to overcome these obstacles:   Summary of Patient Progress Randall Barry was engaged and participated throughout the group session. Randall Barry reports his main obstacle is that he does not what he plans are once he discharges. Randall Barry states that he does not know what is wrong with him. Randall Barry states that he hopes to formulate a plan while in the hospital.     Therapeutic Modalities:  Cognitive Behavioral Therapy Solution Focused Therapy Motivational Interviewing Relapse Prevention Therapy   Randall Barry LCSWA Clinical Social Worker

## 2017-12-09 NOTE — Plan of Care (Signed)
  Problem: Activity: Goal: Interest or engagement in activities will improve Outcome: Progressing   Problem: Safety: Goal: Periods of time without injury will increase Outcome: Progressing   Problem: Medication: Goal: Compliance with prescribed medication regimen will improve Outcome: Progressing DAR NOTE: Patient presents with anxious affect and depressed mood.  Denies suicidal thoughts, auditory and visual hallucinations.  No acute respiratory distress noted.  Reports good night sleep with prn sleep medications.  Rates depression at 3, hopelessness at 3, and anxiety at 7.  Maintained on routine safety checks.  Medications given as prescribed.  Support and encouragement offered as needed.  Attended group and participated.  States goal for today is "self worth."  Patient observed socializing with peers in the dayroom.  Requested and received Vistaril 10 mg for complain of anxiety with good effect.

## 2017-12-09 NOTE — Progress Notes (Signed)
Recreation Therapy Notes  Animal-Assisted Activity (AAA) Program Checklist/Progress Notes Patient Eligibility Criteria Checklist & Daily Group note for Rec Tx Intervention  Date: 7.2.19 Time: 1430 [address]  AAA/T Program Assumption of Risk Form signed by Engineer, productionatient/ or Parent Legal Guardian YES  Patient is free of allergies or sever asthma YES   Patient reports no fear of animals YES   Patient reports no history of cruelty to animals YES   Patient understands his/her participation is voluntary YES   Patient washes hands before animal contact YES   Patient washes hands after animal contact YES   Behavioral Response: Engaged  Education: Charity fundraiserHand Washing, Appropriate Animal Interaction   Education Outcome: Acknowledges understanding/In group clarification offered/Needs additional education.   Clinical Observations/Feedback: Pt attended and participated in activity.    Caroll RancherMarjette Parminder Cupples, LRT/CTRS         Caroll RancherLindsay, Kesha Hurrell A 12/09/2017 3:39 PM

## 2017-12-09 NOTE — [facility] Group Notes (Signed)
Pt attended spiritual care group on grief and loss facilitated by chaplain Mcarthur Ivins   Group opened with brief discussion and psycho-social ed around grief and loss in relationships and in relation to self - identifying life patterns, circumstances, changes that cause losses. Established group norm of speaking from own life experience. Group goal of establishing open and affirming space for members to share loss and experience with grief, normalize grief experience and provide psycho social education and grief support.     

## 2017-12-09 NOTE — Plan of Care (Signed)
D: Pt denies SI/HI/AVH. Pt is pleasant and cooperative. Pt stated he was feeling better due to "anger meds" . Pt stated he was working on his anger issues. Pt was informed that his anger is something he could not control but his actions were controllable. Pt appeared to understand, so pt was encouraged to work on coping mechanisms along with what to do when he gets angry.   A: Pt was offered support and encouragement. Pt was given scheduled medications. Pt was encourage to attend groups. Q 15 minute checks were done for safety.   R:Pt attends groups and interacts well with peers and staff. Pt is taking medication. Pt receptive to treatment and safety maintained on unit.   Problem: Education: Goal: Emotional status will improve Outcome: Progressing   Problem: Education: Goal: Mental status will improve Outcome: Progressing   Problem: Activity: Goal: Interest or engagement in activities will improve Outcome: Progressing   Problem: Safety: Goal: Periods of time without injury will increase Outcome: Progressing

## 2017-12-10 NOTE — Plan of Care (Signed)
Pt progressing in the following metrics  D: pt presented to the medication window, pleasant. Pt stated he slept well last night. Pt rates his depression/hopelessness/anxiety a 0/0/2 respectively. Pt denied any physical pain to this writer but wrote he was having chest pain at a 5/10. Pt states his goal for today is to stay happy. Pt will achieve this by laughing and carrying on with his friends. Pt denies any si/hi/ah/vh and verbally agrees to approach staff if these become apparent.  A: pt provided support, encouragement, and medications per protocol and standing orders. [id number] safety checks implemented and continued.  R: pt safe on the unit. Will continue to monitor.    Problem: Education: Goal: Mental status will improve Outcome: Progressing   Problem: Activity: Goal: Interest or engagement in activities will improve Outcome: Progressing Goal: Sleeping patterns will improve Outcome: Progressing   Problem: Coping: Goal: Ability to demonstrate self-control will improve Outcome: Progressing   Problem: Health Behavior/Discharge Planning: Goal: Compliance with treatment plan for underlying cause of condition will improve Outcome: Progressing   Problem: Physical Regulation: Goal: Ability to maintain clinical measurements within normal limits will improve Outcome: Progressing   Problem: Safety: Goal: Periods of time without injury will increase Outcome: Progressing   Problem: Medication: Goal: Compliance with prescribed medication regimen will improve Outcome: Progressing   Problem: Self-Concept: Goal: Will verbalize positive feelings about self Outcome: Progressing

## 2017-12-10 NOTE — Progress Notes (Signed)
[facility] MD Progress Note  12/10/2017 11:35 AM SHEAN GERDING  MRN:  [id number] Subjective: Patient is seen and examined.  Patient is a 47 year old male with a past psychiatric history significant for major depression.  He is seen in follow-up.  He stated he is doing better today.  He stated his anxiety and outlook for the future improved.  He slept well.  His appetite is improved.  He denied any suicidal ideation.  We discussed possible discharge in 1 to 2 days.  He is hopeful about the future.  We discussed side effects of medications.  He is having some orthostatic hypotension, and we discussed means by which to deal with that.  Again, he denied any suicidal ideation today. Principal Problem: <principal problem not specified> Diagnosis:   Patient Active Problem List   Diagnosis Date Noted  . Severe recurrent major depression without psychotic features (HCC) [F33.2] 12/05/2017  . Chronic respiratory failure with hypoxia (HCC) [J96.11] 09/10/2017  . Tobacco abuse [Z72.0] 08/26/2017  . 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 COPD (chronic obstructive pulmonary disease) (HCC)   . Hyperlipidemia     Past Surgical History:  Procedure Laterality Date  . FINGER SURGERY     Family History: History reviewed. No pertinent family history. Family Psychiatric  History: See admission H&P Social History:  Social History   Substance and Sexual Activity  Alcohol Use Not Currently     Social History   Substance and Sexual Activity  Drug Use Yes  . Types: Marijuana   Comment: occ    Social History   Socioeconomic History  . Marital status: Married    Spouse name: Not on file  . Number of children: Not on file  . Years of education: Not on file  . Highest education level: Not on file  Occupational History  . Not  on file  Social Needs  . Financial resource strain: Not on file  . Food insecurity:    Worry: Not on file    Inability: Not on file  . Transportation needs:    Medical: Not on file    Non-medical: Not on file  Tobacco Use  . Smoking status: Current Every Day Smoker    Packs/day: 2.00  . Smokeless tobacco: Never Used  Substance and Sexual Activity  . Alcohol use: Not Currently  . Drug use: Yes    Types: Marijuana    Comment: occ  . Sexual activity: Not Currently    Birth control/protection: None  Lifestyle  . Physical activity:    Days per week: Not on file    Minutes per session: Not on file  . Stress: Not on file  Relationships  . Social connections:    Talks on phone: Not on file    Gets together: Not on file    Attends religious service: Not on file    Active member of club or organization: Not on file    Attends meetings of clubs or organizations: Not on file    Relationship status: Not on file  Other Topics Concern  . Not on file  Social History Narrative  . Not on file   Additional Social History:                         Sleep:  Good  Appetite:  Fair  Current Medications: Current Facility-Administered Medications  Medication Dose Route Frequency Provider Last Rate Last Dose  . albuterol (PROVENTIL HFA;VENTOLIN HFA) 108 (90 Base) MCG/ACT inhaler 2 puff  2 puff Inhalation Q6H PRN Nira ConnBerry, Jason A, NP   2 puff at 12/09/17 1647  . alum & mag hydroxide-simeth (MAALOX/MYLANTA) 200-200-20 MG/5ML suspension 30 mL  30 mL Oral Q4H PRN Nira ConnBerry, Jason A, NP      . buPROPion (WELLBUTRIN XL) 24 hr tablet 300 mg  300 mg Oral Daily Antonieta Pertlary, Harlow Basley Lawson, MD   300 mg at 12/10/17 0804  . feeding supplement (ENSURE ENLIVE) (ENSURE ENLIVE) liquid 237 mL  237 mL Oral BID BM Cobos, Rockey SituFernando A, MD   237 mL at 12/10/17 0924  . gabapentin (NEURONTIN) capsule 100 mg  100 mg Oral TID Antonieta Pertlary, Elliette Seabolt Lawson, MD   100 mg at 12/10/17 0801  . hydrOXYzine (ATARAX/VISTARIL) tablet 10 mg  10  mg Oral TID PRN Cobos, Rockey SituFernando A, MD   10 mg at 12/09/17 2129  . ibuprofen (ADVIL,MOTRIN) tablet 200 mg  200 mg Oral Q6H PRN Nira ConnBerry, Jason A, NP   200 mg at 12/07/17 0811  . loperamide (IMODIUM) capsule 2 mg  2 mg Oral PRN Money, Feliz Beamravis B, FNP      . magnesium hydroxide (MILK OF MAGNESIA) suspension 30 mL  30 mL Oral Daily PRN Nira ConnBerry, Jason A, NP   30 mL at 12/07/17 1656  . multivitamin with minerals tablet 1 tablet  1 tablet Oral Daily Cobos, Rockey SituFernando A, MD   1 tablet at 12/10/17 0802  . sertraline (ZOLOFT) tablet 50 mg  50 mg Oral Daily Cobos, Rockey SituFernando A, MD   50 mg at 12/10/17 0802  . traZODone (DESYREL) tablet 100 mg  100 mg Oral QHS PRN,MR X 1 Antonieta Pertlary, Vienne Corcoran Lawson, MD   100 mg at 12/09/17 2129  . umeclidinium bromide (INCRUSE ELLIPTA) 62.5 MCG/INH 1 puff  1 puff Inhalation Daily Nira ConnBerry, Jason A, NP   1 puff at 12/10/17 0801    Lab Results: No results found for this or any previous visit (from the past 48 hour(s)).  Blood Alcohol level:  Lab Results  Component Value Date   ETH <10 12/04/2017    Metabolic Disorder Labs: No results found for: HGBA1C, MPG No results found for: PROLACTIN No results found for: CHOL, TRIG, HDL, CHOLHDL, VLDL, LDLCALC  Physical Findings: AIMS: Facial and Oral Movements Muscles of Facial Expression: None, normal Lips and Perioral Area: None, normal Jaw: None, normal Tongue: None, normal,Extremity Movements Upper (arms, wrists, hands, fingers): None, normal Lower (legs, knees, ankles, toes): None, normal, Trunk Movements Neck, shoulders, hips: None, normal, Overall Severity Severity of abnormal movements (highest score from questions above): None, normal Incapacitation due to abnormal movements: None, normal Patient's awareness of abnormal movements (rate only patient's report): No Awareness, Dental Status Current problems with teeth and/or dentures?: No Does patient usually wear dentures?: No  CIWA:    COWS:     Musculoskeletal: Strength & Muscle  Tone: within normal limits Gait & Station: normal Patient leans: N/A  Psychiatric Specialty Exam: Physical Exam  Nursing note and vitals reviewed. Constitutional: He appears well-developed and well-nourished.  HENT:  Head: Normocephalic and atraumatic.  Respiratory: Effort normal and breath sounds normal.  Neurological: He is alert.    ROS  Blood pressure 102/80, pulse 97, temperature 97.7 F (36.5 C), temperature source Oral, resp. rate 16, height 5' 6.54" (1.69 m), weight 53.5 kg (118 lb),  SpO2 98 %.Body mass index is 18.74 kg/m.  General Appearance: Casual  Eye Contact:  Fair  Speech:  Normal Rate  Volume:  Normal  Mood:  Anxious  Affect:  Congruent  Thought Process:  Coherent  Orientation:  Full (Time, Place, and Person)  Thought Content:  Logical  Suicidal Thoughts:  No  Homicidal Thoughts:  No  Memory:  Immediate;   Fair Recent;   Fair Remote;   Fair  Judgement:  Intact  Insight:  Fair  Psychomotor Activity:  Normal  Concentration:  Concentration: Fair and Attention Span: Fair  Recall:  [employer] of Knowledge:  Fair  Language:  Fair  Akathisia:  Negative  Handed:  Right  AIMS (if indicated):     Assets:  Communication Skills Desire for Improvement Resilience  ADL's:  Intact  Cognition:  WNL  Sleep:  Number of Hours: 5.75     Treatment Plan Summary: Daily contact with patient to assess and evaluate symptoms and progress in treatment, Medication management and Plan Patient is seen and examined.  Patient's 47 year old male with the above-stated past psychiatric history seen in follow-up.  He is doing better after the addition of the gabapentin and increase in the Wellbutrin.  We will continue the sertraline at 50 mg p.o. daily.  No other changes medications.  If he continues to improve, we will look at discharge on 12/12/2017.  No change in his medications today.  Antonieta Pert, MD 12/10/2017, 11:35 AM

## 2017-12-10 NOTE — Tx Team (Addendum)
Interdisciplinary Treatment and Diagnostic Plan Update  12/10/2017 Time of Session: 9:30am Randall Barry MRN: [id number]  Principal Diagnosis: <principal problem not specified>  Secondary Diagnoses: Active Problems:   Severe recurrent major depression without psychotic features (HCC)   Current Medications:  Current Facility-Administered Medications  Medication Dose Route Frequency Provider Last Rate Last Dose  . albuterol (PROVENTIL HFA;VENTOLIN HFA) 108 (90 Base) MCG/ACT inhaler 2 puff  2 puff Inhalation Q6H PRN Nira Conn A, NP   2 puff at 12/09/17 1647  . alum & mag hydroxide-simeth (MAALOX/MYLANTA) 200-200-20 MG/5ML suspension 30 mL  30 mL Oral Q4H PRN Nira Conn A, NP      . buPROPion (WELLBUTRIN XL) 24 hr tablet 300 mg  300 mg Oral Daily Antonieta Pert, MD   300 mg at 12/10/17 0804  . feeding supplement (ENSURE ENLIVE) (ENSURE ENLIVE) liquid 237 mL  237 mL Oral BID BM Cobos, Rockey Situ, MD   237 mL at 12/10/17 0924  . gabapentin (NEURONTIN) capsule 100 mg  100 mg Oral TID Antonieta Pert, MD   100 mg at 12/10/17 0801  . hydrOXYzine (ATARAX/VISTARIL) tablet 10 mg  10 mg Oral TID PRN Cobos, Rockey Situ, MD   10 mg at 12/09/17 2129  . ibuprofen (ADVIL,MOTRIN) tablet 200 mg  200 mg Oral Q6H PRN Nira Conn A, NP   200 mg at 12/07/17 0811  . loperamide (IMODIUM) capsule 2 mg  2 mg Oral PRN Money, Feliz Beam B, FNP      . magnesium hydroxide (MILK OF MAGNESIA) suspension 30 mL  30 mL Oral Daily PRN Nira Conn A, NP   30 mL at 12/07/17 1656  . multivitamin with minerals tablet 1 tablet  1 tablet Oral Daily Cobos, Rockey Situ, MD   1 tablet at 12/10/17 0802  . sertraline (ZOLOFT) tablet 50 mg  50 mg Oral Daily Cobos, Rockey Situ, MD   50 mg at 12/10/17 0802  . traZODone (DESYREL) tablet 100 mg  100 mg Oral QHS PRN,MR X 1 Antonieta Pert, MD   100 mg at 12/09/17 2129  . umeclidinium bromide (INCRUSE ELLIPTA) 62.5 MCG/INH 1 puff  1 puff Inhalation Daily Nira Conn A, NP   1  puff at 12/10/17 0801   PTA Medications: Medications Prior to Admission  Medication Sig Dispense Refill Last Dose  . albuterol (PROVENTIL HFA;VENTOLIN HFA) 108 (90 Base) MCG/ACT inhaler Inhale 2 puffs into the lungs every 6 (six) hours as needed for wheezing or shortness of breath. 1 Inhaler 0 Past Week at Unknown time  . ibuprofen (ADVIL,MOTRIN) 200 MG tablet Take 200 mg by mouth every 6 (six) hours as needed.   prn at unk  . umeclidinium bromide (INCRUSE ELLIPTA) 62.5 MCG/INH AEPB Inhale 1 puff into the lungs daily. 30 each 6 Past Week at Unknown time    Patient Stressors: Health problems Marital or family conflict Medication change or noncompliance  Patient Strengths: Capable of independent living General fund of knowledge  Treatment Modalities: Medication Management, Group therapy, Case management,  1 to 1 session with clinician, Psychoeducation, Recreational therapy.   Physician Treatment Plan for Primary Diagnosis: <principal problem not specified> Long Term Goal(s): Improvement in symptoms so as ready for discharge Improvement in symptoms so as ready for discharge   Short Term Goals: Ability to identify changes in lifestyle to reduce recurrence of condition will improve Ability to identify and develop effective coping behaviors will improve Ability to identify changes in lifestyle to reduce recurrence of condition will  improve Ability to identify and develop effective coping behaviors will improve  Medication Management: Evaluate patient's response, side effects, and tolerance of medication regimen.  Therapeutic Interventions: 1 to 1 sessions, Unit Group sessions and Medication administration.  Evaluation of Outcomes: Progressing  Physician Treatment Plan for Secondary Diagnosis: Active Problems:   Severe recurrent major depression without psychotic features (HCC)  Long Term Goal(s): Improvement in symptoms so as ready for discharge Improvement in symptoms so as ready  for discharge   Short Term Goals: Ability to identify changes in lifestyle to reduce recurrence of condition will improve Ability to identify and develop effective coping behaviors will improve Ability to identify changes in lifestyle to reduce recurrence of condition will improve Ability to identify and develop effective coping behaviors will improve     Medication Management: Evaluate patient's response, side effects, and tolerance of medication regimen.  Therapeutic Interventions: 1 to 1 sessions, Unit Group sessions and Medication administration.  Evaluation of Outcomes: Progressing   RN Treatment Plan for Primary Diagnosis: <principal problem not specified> Long Term Goal(s): Knowledge of disease and therapeutic regimen to maintain health will improve  Short Term Goals: Ability to disclose and discuss suicidal ideas, Ability to identify and develop effective coping behaviors will improve and Compliance with prescribed medications will improve  Medication Management: RN will administer medications as ordered by provider, will assess and evaluate patient's response and provide education to patient for prescribed medication. RN will report any adverse and/or side effects to prescribing provider.  Therapeutic Interventions: 1 on 1 counseling sessions, Psychoeducation, Medication administration, Evaluate responses to treatment, Monitor vital signs and CBGs as ordered, Perform/monitor CIWA, COWS, AIMS and Fall Risk screenings as ordered, Perform wound care treatments as ordered.  Evaluation of Outcomes: Progressing   LCSW Treatment Plan for Primary Diagnosis: <principal problem not specified> Long Term Goal(s): Safe transition to appropriate next level of care at discharge, Engage patient in therapeutic group addressing interpersonal concerns.  Short Term Goals: Engage patient in aftercare planning with referrals and resources  Therapeutic Interventions: Assess for all discharge needs,  1 to 1 time with Social worker, Explore available resources and support systems, Assess for adequacy in community support network, Educate family and significant other(s) on suicide prevention, Complete Psychosocial Assessment, Interpersonal group therapy.  Evaluation of Outcomes: Progressing   Progress in Treatment: Attending groups: Yes. Participating in groups: Yes. Taking medication as prescribed: Yes. Toleration medication: Yes. Family/Significant other contact made: Yes, individual(s) contacted:  the patient's father Patient understands diagnosis: Yes. Discussing patient identified problems/goals with staff: Yes. Medical problems stabilized or resolved: Yes. Denies suicidal/homicidal ideation: Yes. Issues/concerns per patient self-inventory: No. Other:   New problem(s) identified: None   New Short Term/Long Term Goal(s): medication stabilization, elimination of SI thoughts, development of comprehensive mental wellness plan.    Patient Goals:  "I need help. I want to die."  Discharge Plan or Barriers: Patient plans to return home with his family and [facility].   Reason for Continuation of Hospitalization: Depression Medication stabilization Suicidal ideation  Estimated Length of Stay: 3-5 days   Attendees: Patient: Randall Barry 12/10/2017 11:16 AM  Physician: Dr. Nehemiah MassedFernando Cobos, MD; Dr. Landry MellowGreg Clary, MD 12/10/2017 11:16 AM  Nursing: Alexia FreestonePatty.D, RN; Casimiro NeedleMichael, RN 12/10/2017 11:16 AM  RN Care Manager:X 12/10/2017 11:16 AM  Social Worker: Baldo DaubJolan Aleaha Fickling, LCSWA 12/10/2017 11:16 AM  Recreational Therapist: Juliann ParesX 12/10/2017 11:16 AM  Other: X 12/10/2017 11:16 AM  Other:  X 12/10/2017 11:16 AM  Other:X 12/10/2017 11:16 AM    Scribe for Treatment Team: Maeola Sarah, LCSWA 12/10/2017 11:16 AM

## 2017-12-10 NOTE — Progress Notes (Signed)
Adult Psychoeducational Group Note  Date:  12/10/2017 Time:  11:22 PM  Group Topic/Focus:  Wrap-Up Group:   The focus of this group is to help patients review their daily goal of treatment and discuss progress on daily workbooks.  Participation Level:  Active  Participation Quality:  Appropriate  Affect:  Appropriate  Cognitive:  Appropriate  Insight: Appropriate  Engagement in Group:  Engaged and Supportive  Modes of Intervention:  Discussion  Additional Comments:  Pt expressed that he had the best day that he has had during his stay because of the way his medicine was distributed.  Pt expressed to the others that he was there for them if they needed someone to talk to.  Pt rated their day at 10/10.  Lisel Siegrist 12/10/2017, 11:22 PM

## 2017-12-10 NOTE — Therapy (Signed)
Occupational Therapy Group Treatment Note  Date:  12/10/2017 Time:  2:58 PM  Group Topic/Focus:  Stress Management  Participation Level:  Active  Participation Quality:  Appropriate and Sharing  Affect:  Flat  Cognitive:  Appropriate  Insight: Improving  Engagement in Group:  Engaged  Modes of Intervention:  Activity, Discussion, Education and Socialization  Additional Comments:    S: "I suppress my emotions a lot"  O: Stress management group completed to use as productive coping strategy, to help mitigate maladaptive coping to integrate in functional BADL/IADL. Stress management tool worksheet discussed to educate on unhealthy vs healthy coping skills to manage stress to improve community integration. Coping strategies taught include: relaxation based- deep breathing, counting to 10, taking a 1 minute vacation, acceptance, stress balls, relaxation audio/video, visual/mental imagery. Positive mental attitude- gratitude, acceptance, cognitive reframing, positive self talk, anger management. Coping skills bingo played with education given on variety of coping skills between bingo calls. Pts encouraged to share experience with various coping skills and share what has worked for them with others. Coloring and relaxation guide handouts given at the end of the session.   A: Pt presents to group with flat affect, appropriate and sharing throughout group. Stress management tools worksheet completed, pt stating he suppresses his emotions and can often act out. Pt would like to try relaxation and positive self talk this date. Pt questioned if positive self talk meant he had schizophrenia, proper education provided. Pt engaged in coping skills bingo, very engaged stating he loves to play games. By end of group pt smiling and laughing with peers. Pt acquired handouts at end of session.  P: Pt provided with education on stress management activities to implement into daily routine. Handouts given to  facilitate carryover when reintegrating into community     [facility] Hospital[facility]lee Keela Rubert, [place], OTR/L  [employer]Kaylee Rayelle Armor 12/10/2017, 2:58 PM

## 2017-12-11 MED ORDER — MELATONIN 5 MG PO TABS
5.0000 mg | ORAL_TABLET | Freq: Every day | ORAL | Status: DC
  Administered 2017-12-11: 5 mg via ORAL
  Filled 2017-12-11 (×3): qty 1

## 2017-12-11 NOTE — Progress Notes (Addendum)
Pt reports he had a great day.  He was in the dayroom at the beginning of the shift talking with the other patients, and seemed to be having a good time, laughing and joking with peers.  He denies SI/HI/AVH.  He makes his needs known to staff.  He says that last night he was able to sleep better than he has in several days.  Writer told him that we would do the same routine as he did last night.  Meds given as requested.  Support and encouragement offered.  Discharge plans are in process.  Safety maintained with q15 minute checks.   0030:  Pt up stating that he is not able to sleep.  He is frustrated that the same meds as last night did not net the same results.  He feels the Trazodone is not the right med for him.  Pt was encouraged to tell the doctor in the morning that he wants to wants to try something different to help his sleep.  Pt was given the second dose of Trazodone at that time.  Pt returned to bed.

## 2017-12-11 NOTE — Progress Notes (Signed)
Pt presents with a flat affect and anxious mood. Pt rated on his self inventory sheet: depression 0, anxiety 4, hopelessness 0. Pt expressed difficulty sleeping last night after taking trazodone. Pt stated, "I'm not going to continue taking an antidepressant for sleep, when it's not working". Writer informed MD of pt's complaint. Pt denies SI/HI.  Orders reviewed with pt. V/s assessed. Self inventory sheet reviewed. Verbal support provided. Pt encouraged to attend groups. 15 minute checks performed for safety.   Pt compliant with tx plan.

## 2017-12-11 NOTE — Progress Notes (Signed)
D:  Koleen Nimroddrian has been up and visible in the day room.  He interacts well with staff and peers.  He denied SI/HI or A/V hallucinations.  He reported having a visit from his wife and step son this evening and that went "ok."  He has been struggling with his discharge plan because a peer told him in group that he doesn't need to go home.  He voiced that his plan is to return home, work on trying to find a job and then leave the toxic relationship with his wife.  "I don't want to go home but the anxiety about leaving here without housing and no job is scary."  He voiced wanting to get a job but will need assistance in finding one that he can do with his medical issues. [facility]    A:  1:1 with RN for support and encouragement.  Medications as ordered.  Q 15 minute checks maintained for safety.  Encouraged participation in group and unit activities.   R:  Koleen Nimroddrian remains safe on the unit.  We will continue to monitor the progress towards his goals.

## 2017-12-11 NOTE — Plan of Care (Signed)
Randall Barry takes his medications as prescribed.  He is able to verbalize his medications as well as why he is taking them.  We will continue to monitor the progress towards his goals.

## 2017-12-11 NOTE — Progress Notes (Signed)
Adult Psychoeducational Group Note  Date:  12/11/2017 Time:  9:44 PM  Group Topic/Focus:  Wrap-Up Group:   The focus of this group is to help patients review their daily goal of treatment and discuss progress on daily workbooks.  Participation Level:  Active  Participation Quality:  Appropriate  Affect:  Appropriate  Cognitive:  Appropriate  Insight: Appropriate  Engagement in Group:  Engaged  Modes of Intervention:  Discussion  Additional Comments:  Pt expressed he did not sleep well, which affected his day.  Pt rated the day at 5/10.  Isebella Upshur 12/11/2017, 9:44 PM

## 2017-12-11 NOTE — Plan of Care (Signed)
  Problem: Coping: Goal: Ability to verbalize frustrations and anger appropriately will improve Outcome: Progressing   Problem: Safety: Goal: Periods of time without injury will increase Outcome: Progressing   

## 2017-12-11 NOTE — Progress Notes (Signed)
[facility] MD Progress Note  12/11/2017 12:50 PM Randall Barry  MRN:  [id number] Subjective: Patient is seen and examined.  Patient's 47 year old male with a past psychiatric history significant for major depression.  He seen in follow-up.  He had another good day yesterday.  He did not sleep well last night, and does not want to take the trazodone.  He stated it made him feel lethargic.  We discussed the fact that he is probably going to be discharged tomorrow, and that would probably best not to add any medicines that might lead to additional side effects and extend his hospitalization.  He is in agreement with this.  He denied any suicidal ideation.  No other problems today.  He stated his greatest fear was returning home, and that "everyone smokes there, and I am going to end up wanting a cigarette". Principal Problem: <principal problem not specified> Diagnosis:   Patient Active Problem List   Diagnosis Date Noted  . Severe recurrent major depression without psychotic features (HCC) [F33.2] 12/05/2017  . Chronic respiratory failure with hypoxia (HCC) [J96.11] 09/10/2017  . Tobacco abuse [Z72.0] 08/26/2017  . 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 COPD (chronic obstructive pulmonary disease) (HCC)   . Hyperlipidemia     Past Surgical History:  Procedure Laterality Date  . FINGER SURGERY     Family History: History reviewed. No pertinent family history. Family Psychiatric  History: See admission H&P Social History:  Social History   Substance and Sexual Activity  Alcohol Use Not Currently     Social History   Substance and Sexual Activity  Drug Use Yes  . Types: Marijuana   Comment: occ    Social History   Socioeconomic History  . Marital status: Married    Spouse name: Not on file  . Number of  children: Not on file  . Years of education: Not on file  . Highest education level: Not on file  Occupational History  . Not on file  Social Needs  . Financial resource strain: Not on file  . Food insecurity:    Worry: Not on file    Inability: Not on file  . Transportation needs:    Medical: Not on file    Non-medical: Not on file  Tobacco Use  . Smoking status: Current Every Day Smoker    Packs/day: 2.00  . Smokeless tobacco: Never Used  Substance and Sexual Activity  . Alcohol use: Not Currently  . Drug use: Yes    Types: Marijuana    Comment: occ  . Sexual activity: Not Currently    Birth control/protection: None  Lifestyle  . Physical activity:    Days per week: Not on file    Minutes per session: Not on file  . Stress: Not on file  Relationships  . Social connections:    Talks on phone: Not on file    Gets together: Not on file    Attends religious service: Not on file    Active member of club or organization: Not on file    Attends meetings of clubs or organizations: Not on file    Relationship status: Not on file  Other Topics Concern  . Not on file  Social History Narrative  . Not on file   Additional Social History:  Sleep: Fair  Appetite:  Good  Current Medications: Current Facility-Administered Medications  Medication Dose Route Frequency Provider Last Rate Last Dose  . albuterol (PROVENTIL HFA;VENTOLIN HFA) 108 (90 Base) MCG/ACT inhaler 2 puff  2 puff Inhalation Q6H PRN Nira ConnBerry, Jason A, NP   2 puff at 12/10/17 2253  . alum & mag hydroxide-simeth (MAALOX/MYLANTA) 200-200-20 MG/5ML suspension 30 mL  30 mL Oral Q4H PRN Nira ConnBerry, Jason A, NP      . buPROPion (WELLBUTRIN XL) 24 hr tablet 300 mg  300 mg Oral Daily Antonieta Pertlary, Greg Lawson, MD   300 mg at 12/11/17 0802  . feeding supplement (ENSURE ENLIVE) (ENSURE ENLIVE) liquid 237 mL  237 mL Oral BID BM Cobos, Rockey SituFernando A, MD   237 mL at 12/11/17 1001  . gabapentin (NEURONTIN)  capsule 100 mg  100 mg Oral TID Antonieta Pertlary, Greg Lawson, MD   100 mg at 12/11/17 1207  . hydrOXYzine (ATARAX/VISTARIL) tablet 10 mg  10 mg Oral TID PRN Cobos, Rockey SituFernando A, MD   10 mg at 12/11/17 0804  . ibuprofen (ADVIL,MOTRIN) tablet 200 mg  200 mg Oral Q6H PRN Nira ConnBerry, Jason A, NP   200 mg at 12/07/17 0811  . loperamide (IMODIUM) capsule 2 mg  2 mg Oral PRN Money, Gerlene Burdockravis B, FNP      . magnesium hydroxide (MILK OF MAGNESIA) suspension 30 mL  30 mL Oral Daily PRN Nira ConnBerry, Jason A, NP   30 mL at 12/10/17 1837  . Melatonin TABS 5 mg  5 mg Oral QHS Cobos, Fernando A, MD      . multivitamin with minerals tablet 1 tablet  1 tablet Oral Daily Cobos, Rockey SituFernando A, MD   1 tablet at 12/11/17 0802  . sertraline (ZOLOFT) tablet 50 mg  50 mg Oral Daily Cobos, Rockey SituFernando A, MD   50 mg at 12/11/17 0802  . umeclidinium bromide (INCRUSE ELLIPTA) 62.5 MCG/INH 1 puff  1 puff Inhalation Daily Nira ConnBerry, Jason A, NP   1 puff at 12/11/17 0802    Lab Results: No results found for this or any previous visit (from the past 48 hour(s)).  Blood Alcohol level:  Lab Results  Component Value Date   ETH <10 12/04/2017    Metabolic Disorder Labs: No results found for: HGBA1C, MPG No results found for: PROLACTIN No results found for: CHOL, TRIG, HDL, CHOLHDL, VLDL, LDLCALC  Physical Findings: AIMS: Facial and Oral Movements Muscles of Facial Expression: None, normal Lips and Perioral Area: None, normal Jaw: None, normal Tongue: None, normal,Extremity Movements Upper (arms, wrists, hands, fingers): None, normal Lower (legs, knees, ankles, toes): None, normal, Trunk Movements Neck, shoulders, hips: None, normal, Overall Severity Severity of abnormal movements (highest score from questions above): None, normal Incapacitation due to abnormal movements: None, normal Patient's awareness of abnormal movements (rate only patient's report): No Awareness, Dental Status Current problems with teeth and/or dentures?: No Does patient usually  wear dentures?: No  CIWA:    COWS:     Musculoskeletal: Strength & Muscle Tone: within normal limits Gait & Station: normal Patient leans: N/A  Psychiatric Specialty Exam: Physical Exam  Nursing note and vitals reviewed. Constitutional: He is oriented to person, place, and time. He appears well-developed and well-nourished.  HENT:  Head: Normocephalic and atraumatic.  Respiratory: Effort normal.  Neurological: He is alert and oriented to person, place, and time.    ROS  Blood pressure 96/84, pulse 88, temperature 98.3 F (36.8 C), temperature source Oral, resp. rate 20, height 5' 6.54" (1.69 m),  weight 53.5 kg (118 lb), SpO2 98 %.Body mass index is 18.74 kg/m.  General Appearance: Casual  Eye Contact:  Good  Speech:  Normal Rate  Volume:  Normal  Mood:  Euthymic  Affect:  Congruent  Thought Process:  Coherent  Orientation:  Full (Time, Place, and Person)  Thought Content:  Logical  Suicidal Thoughts:  No  Homicidal Thoughts:  No  Memory:  Immediate;   Fair Recent;   Fair Remote;   Fair  Judgement:  Intact  Insight:  Fair  Psychomotor Activity:  Normal  Concentration:  Concentration: Fair and Attention Span: Fair  Recall:  [employer] of Knowledge:  Fair  Language:  Fair  Akathisia:  Yes  Handed:  Right  AIMS (if indicated):     Assets:  Communication Skills Desire for Improvement Housing Resilience  ADL's:  Intact  Cognition:  WNL  Sleep:  Number of Hours: 5.75     Treatment Plan Summary: Daily contact with patient to assess and evaluate symptoms and progress in treatment, Medication management and Plan Patient is seen and examined.  Patient is a 47 year old male with the above-stated past psychiatric history seen in follow-up.  I am going to just stop the trazodone since he does not want to take it at all.  We have added melatonin 5 mg p.o. nightly if he needs it.  I am negative change any other of his medications today.  Hopefully things will continue to go  well, and we will be able to discharge him home tomorrow.  Antonieta Pert, MD 12/11/2017, 12:50 PM

## 2017-12-12 MED ORDER — HYDROXYZINE HCL 10 MG PO TABS: 10.0000 mg | ORAL_TABLET | Freq: Three times a day (TID) | ORAL | 0 refills | Status: AC | PRN

## 2017-12-12 MED ORDER — SERTRALINE HCL 50 MG PO TABS: 50.0000 mg | ORAL_TABLET | Freq: Every day | ORAL | 0 refills | Status: AC

## 2017-12-12 MED ORDER — BUPROPION HCL ER (XL) 300 MG PO TB24: 300.0000 mg | ORAL_TABLET | Freq: Every day | ORAL | 0 refills | Status: AC

## 2017-12-12 MED ORDER — GABAPENTIN 100 MG PO CAPS: 100.0000 mg | ORAL_CAPSULE | Freq: Three times a day (TID) | ORAL | 0 refills | Status: AC

## 2017-12-12 NOTE — Progress Notes (Signed)
  [facility] :  Will you be returning to the same living situation after discharge:  Yes,  patient reports he is returning home with his wife at discharge At discharge, do you have transportation home?: Yes,  patient reports his wife will pick him up at discharge Do you have the ability to pay for your medications: No.  Release of information consent forms completed and in the chart;  Patient's signature needed at discharge.  Patient to Follow up at: Follow-up Information    [facility] [place] Go to.   Specialty:  Professional Counselor Why:  Walk-in hours are Monday-Friday from 8:00am-12:00pm and 1:00pm-2:30pm. Please be sure to follow up within 3 days of discharge, also bring your Photo ID, SSN, any insurance/income information and any discharge paperwork from this hospitalization.  Contact information: [facility] [address] [place] [address] [phone]           Next level of care provider has access to [facility] Link:yes  Safety Planning and Suicide Prevention discussed: Yes,  with the patient's father  Have you used any form of tobacco in the last 30 days? (Cigarettes, Smokeless Tobacco, Cigars, and/or Pipes): Yes  Has patient been referred to the Quitline?: Patient refused referral  Patient has been referred for addiction treatment: N/A  Maeola SarahJolan E Briscoe Daniello, LCSWA 12/12/2017, 9:53 AM

## 2017-12-12 NOTE — [facility] Suicide Risk Assessment (Signed)
[facility]   Principal Problem: <principal problem not specified> Discharge Diagnoses:  Patient Active Problem List   Diagnosis Date Noted  . Severe recurrent major depression without psychotic features (HCC) [F33.2] 12/05/2017  . Chronic respiratory failure with hypoxia (HCC) [J96.11] 09/10/2017  . Tobacco abuse [Z72.0] 08/26/2017  . 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   Blood pressure 124/81, pulse 77, temperature 98.5 F (36.9 C), temperature source Oral, resp. rate 18, height 5' 6.54" (1.69 m), weight 55.3 kg (122 lb), SpO2 98 %.Body mass index is 19.38 kg/m.  General Appearance: Casual  Eye Contact::  Good  Speech:  Clear and Coherent409  Volume:  Normal  Mood:  Euthymic  Affect:  Appropriate  Thought Process:  Coherent  Orientation:  Full (Time, Place, and Person)  Thought Content:  Logical  Suicidal Thoughts:  No  Homicidal Thoughts:  No  Memory:  Immediate;   Fair Recent;   Fair Remote;   Fair  Judgement:  Intact  Insight:  Fair  Psychomotor Activity:  Normal  Concentration:  Good  Recall:  Good  Fund of Knowledge:Good  Language: Good  Akathisia:  Negative  Handed:  Right  AIMS (if indicated):     Assets:  Communication Skills Desire for Improvement Housing Resilience  Sleep:  Number of Hours: 6.75  Cognition: WNL  ADL's:  Intact   Mental Status Per Nursing Assessment::   On Admission:  Self-harm thoughts  Demographic Factors:  Male, Divorced or widowed, Caucasian and Unemployed  Loss Factors: Decline in physical health  Historical Factors: Impulsivity  Risk Reduction Factors:   Living with another person, especially a relative and Positive coping  skills or problem solving skills  Continued Clinical Symptoms:  Depression:   Impulsivity  Cognitive Features That Contribute To Risk:  None    Suicide Risk:  Minimal: No identifiable suicidal ideation.  Patients presenting with no risk factors but with morbid ruminations; may be classified as minimal risk based on the severity of the depressive symptoms  Follow-up Information    [facility] Go to.   Specialty:  Professional Counselor Why:  Walk-in hours are Monday-Friday from 8:00am-12:00pm and 1:00pm-2:30pm. Please be sure to follow up within 3 days of discharge, also bring your Photo ID, SSN, any insurance/income information and any discharge paperwork from this hospitalization.  Contact information: [facility] [address] [place] [address] [phone]           Plan Of Care/Follow-up recommendations:  Activity:  ad lib  Antonieta PertGreg Lawson Africa Masaki, MD 12/12/2017, 7:45 AM

## 2017-12-12 NOTE — Progress Notes (Signed)
Recreation Therapy Notes  Date: 7.5.19 Time: 0930 Location: 300 [address] Dayroom  Group Topic: Stress Management  Goal Area(s) Addresses:  Patient will verbalize importance of using healthy stress management.  Patient will identify positive emotions associated with healthy stress management.   Intervention: Stress Management  Activity :  Meditation.  LRT introduced the stress management technique of meditation.  LRT played a meditation on resilience that allowed patients to focus on being able to withstand obstacles that arise.  Patients were to listen and follow along as meditation played to engage in the activity.  Education:  Stress Management, Discharge Planning.   Education Outcome: Acknowledges edcuation/In group clarification offered/Needs additional education  Clinical Observations/Feedback: Pt did not attend group.     Camren Lipsett, LRT/CTRS         Royalty Fakhouri A 12/12/2017 11:08 AM 
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
## 2017-12-12 NOTE — Progress Notes (Signed)
Pt was prepared for dc as he completed his daily assessment. On this he wrote he denied SI today and he rated his depression, hopelessness and anxiety " 0/0/0/" , respectively. His dc instructions were completed by MS, RN. He was then given cc of dc instructions ( SRA, AVS, SSP and transition record) and he stated verbal understanding. Sample meds from pharmacy were given to him and he was given prescriptions for 30 day supply of maintenance psych meds. Belongings in his locker were returned to him and he was escorted to bldg entrance.
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
## 2017-12-17 ENCOUNTER — Encounter: Payer: Self-pay | Admitting: Critical Care Medicine

## 2017-12-17 ENCOUNTER — Ambulatory Visit: Payer: Self-pay | Attending: Critical Care Medicine | Admitting: Critical Care Medicine

## 2017-12-17 DIAGNOSIS — Z720 Tobacco use: Secondary | ICD-10-CM

## 2017-12-17 DIAGNOSIS — R110 Nausea: Secondary | ICD-10-CM | POA: Insufficient documentation

## 2017-12-17 DIAGNOSIS — F332 Major depressive disorder, recurrent severe without psychotic features: Secondary | ICD-10-CM | POA: Insufficient documentation

## 2017-12-17 DIAGNOSIS — Z79899 Other long term (current) drug therapy: Secondary | ICD-10-CM | POA: Insufficient documentation

## 2017-12-17 DIAGNOSIS — J42 Unspecified chronic bronchitis: Secondary | ICD-10-CM | POA: Insufficient documentation

## 2017-12-17 DIAGNOSIS — R0789 Other chest pain: Secondary | ICD-10-CM | POA: Insufficient documentation

## 2017-12-17 DIAGNOSIS — J449 Chronic obstructive pulmonary disease, unspecified: Secondary | ICD-10-CM | POA: Insufficient documentation

## 2017-12-17 DIAGNOSIS — R45851 Suicidal ideations: Secondary | ICD-10-CM | POA: Insufficient documentation

## 2017-12-17 DIAGNOSIS — F17210 Nicotine dependence, cigarettes, uncomplicated: Secondary | ICD-10-CM | POA: Insufficient documentation

## 2017-12-17 DIAGNOSIS — E785 Hyperlipidemia, unspecified: Secondary | ICD-10-CM | POA: Insufficient documentation

## 2017-12-17 MED ORDER — ALBUTEROL SULFATE HFA 108 (90 BASE) MCG/ACT IN AERS: 2.0000 | INHALATION_SPRAY | Freq: Four times a day (QID) | RESPIRATORY_TRACT | 4 refills | Status: AC | PRN

## 2017-12-17 MED ORDER — UMECLIDINIUM BROMIDE 62.5 MCG/INH IN AEPB: 1.0000 | INHALATION_SPRAY | Freq: Every day | RESPIRATORY_TRACT | 6 refills | Status: AC

## 2017-12-17 MED FILL — !VENTOLIN HFA INHALER: 108 (90 BAS | 25 days supply | Qty: 18 | Fill #0

## 2017-12-17 MED FILL — !INCRUSE ELLIPTA 62.5 MCG I: 62.5 | 30 days supply | Qty: 30 | Fill #0

## 2017-12-17 NOTE — Assessment & Plan Note (Signed)
Ongoing tobacco use Did not tolerate nicotine lozenges Cannot use Chantix
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
## 2017-12-17 NOTE — Patient Instructions (Addendum)
Stay on Incruse one puff daily Use albuterol as needed 2puff every 6 hours Stay on oxygen at night Return August 14 for follow up

## 2017-12-17 NOTE — Assessment & Plan Note (Signed)
Recent major depression  Per psychiatry

## 2017-12-17 NOTE — Assessment & Plan Note (Signed)
Chronic obstructive lung dz Gold C Plan Cont incruse and prn SABA Smoking cessation

## 2018-01-16 ENCOUNTER — Ambulatory Visit: Payer: Self-pay | Admitting: Nurse Practitioner

## 2018-01-21 ENCOUNTER — Ambulatory Visit: Payer: Self-pay | Admitting: Critical Care Medicine

## 2020-02-05 IMAGING — DX DG CHEST 2V
2 series · 2 of 2 positions shown · non-contrast
Comparison: None.

CLINICAL DATA: Short of breath

EXAM:
CHEST - 2 VIEW

[chest pa]
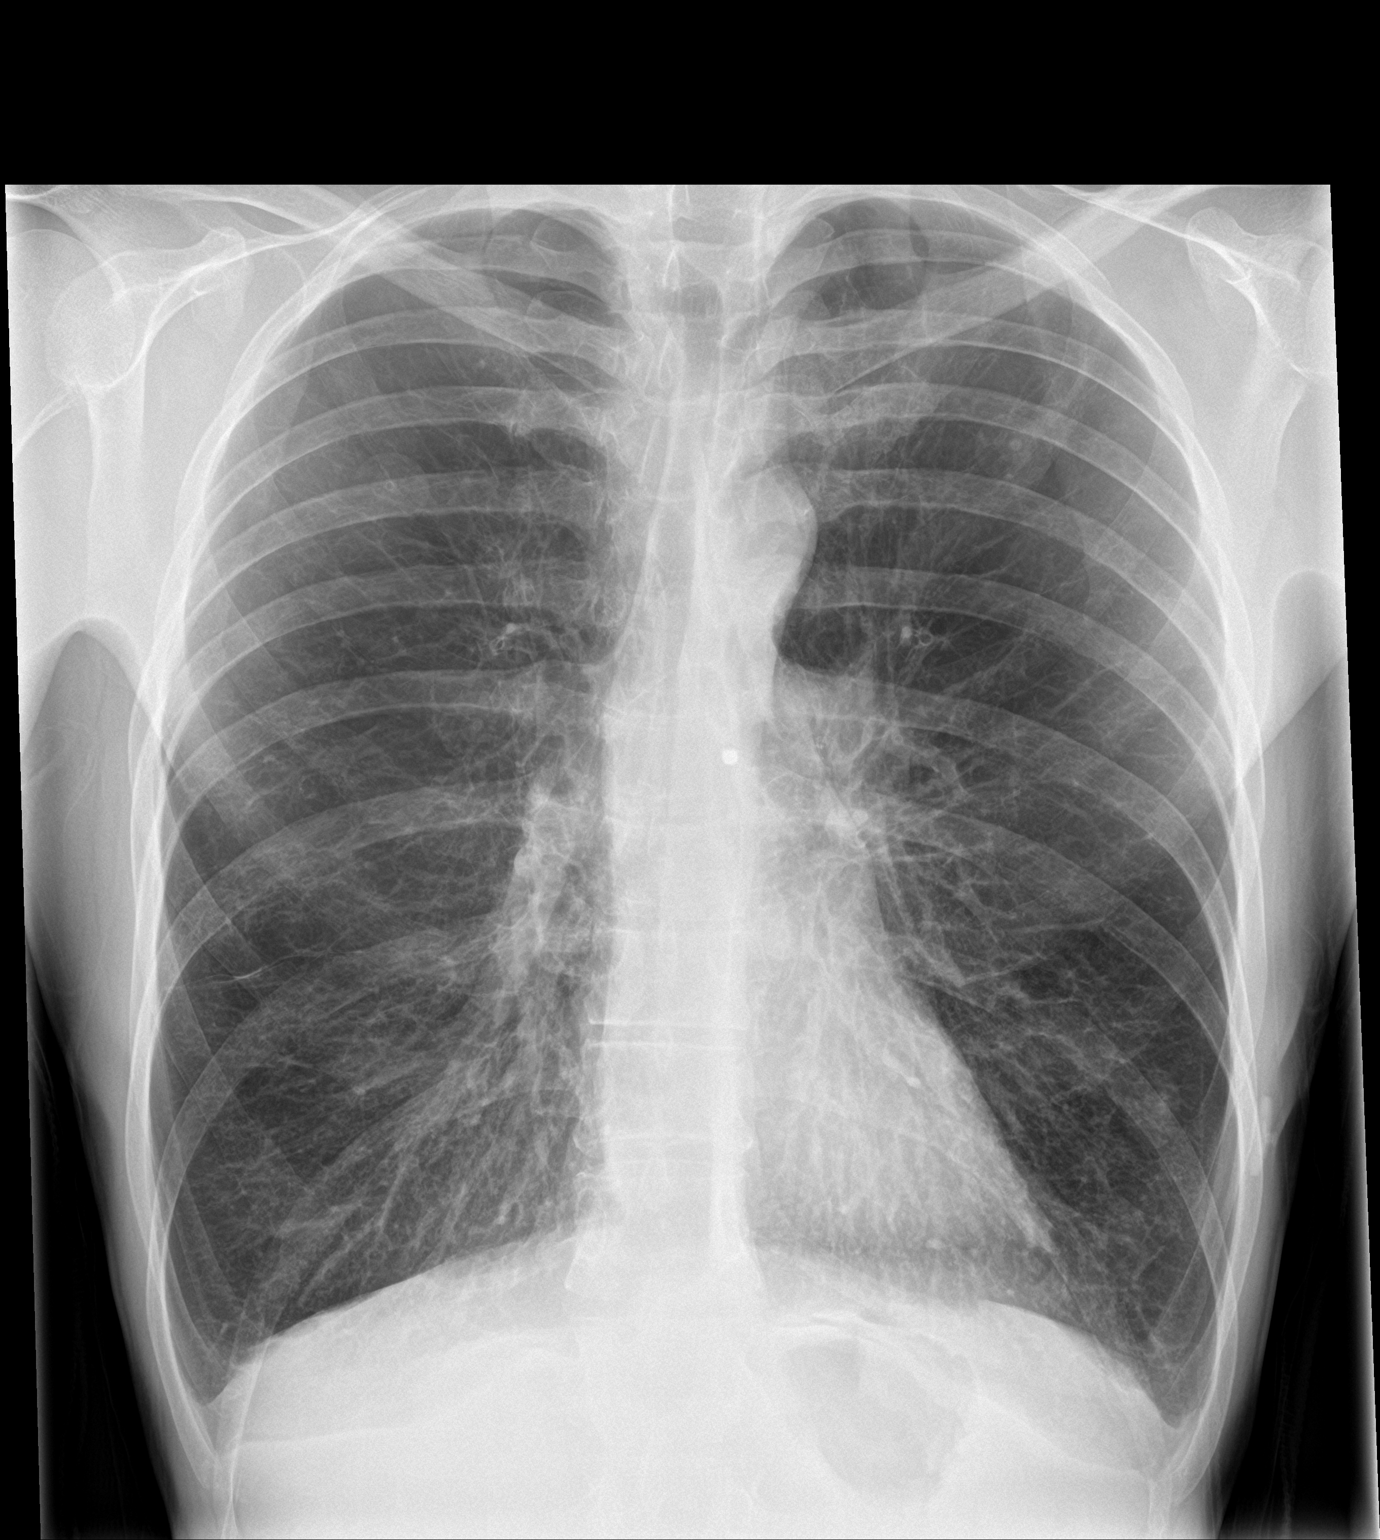

[chest lat]
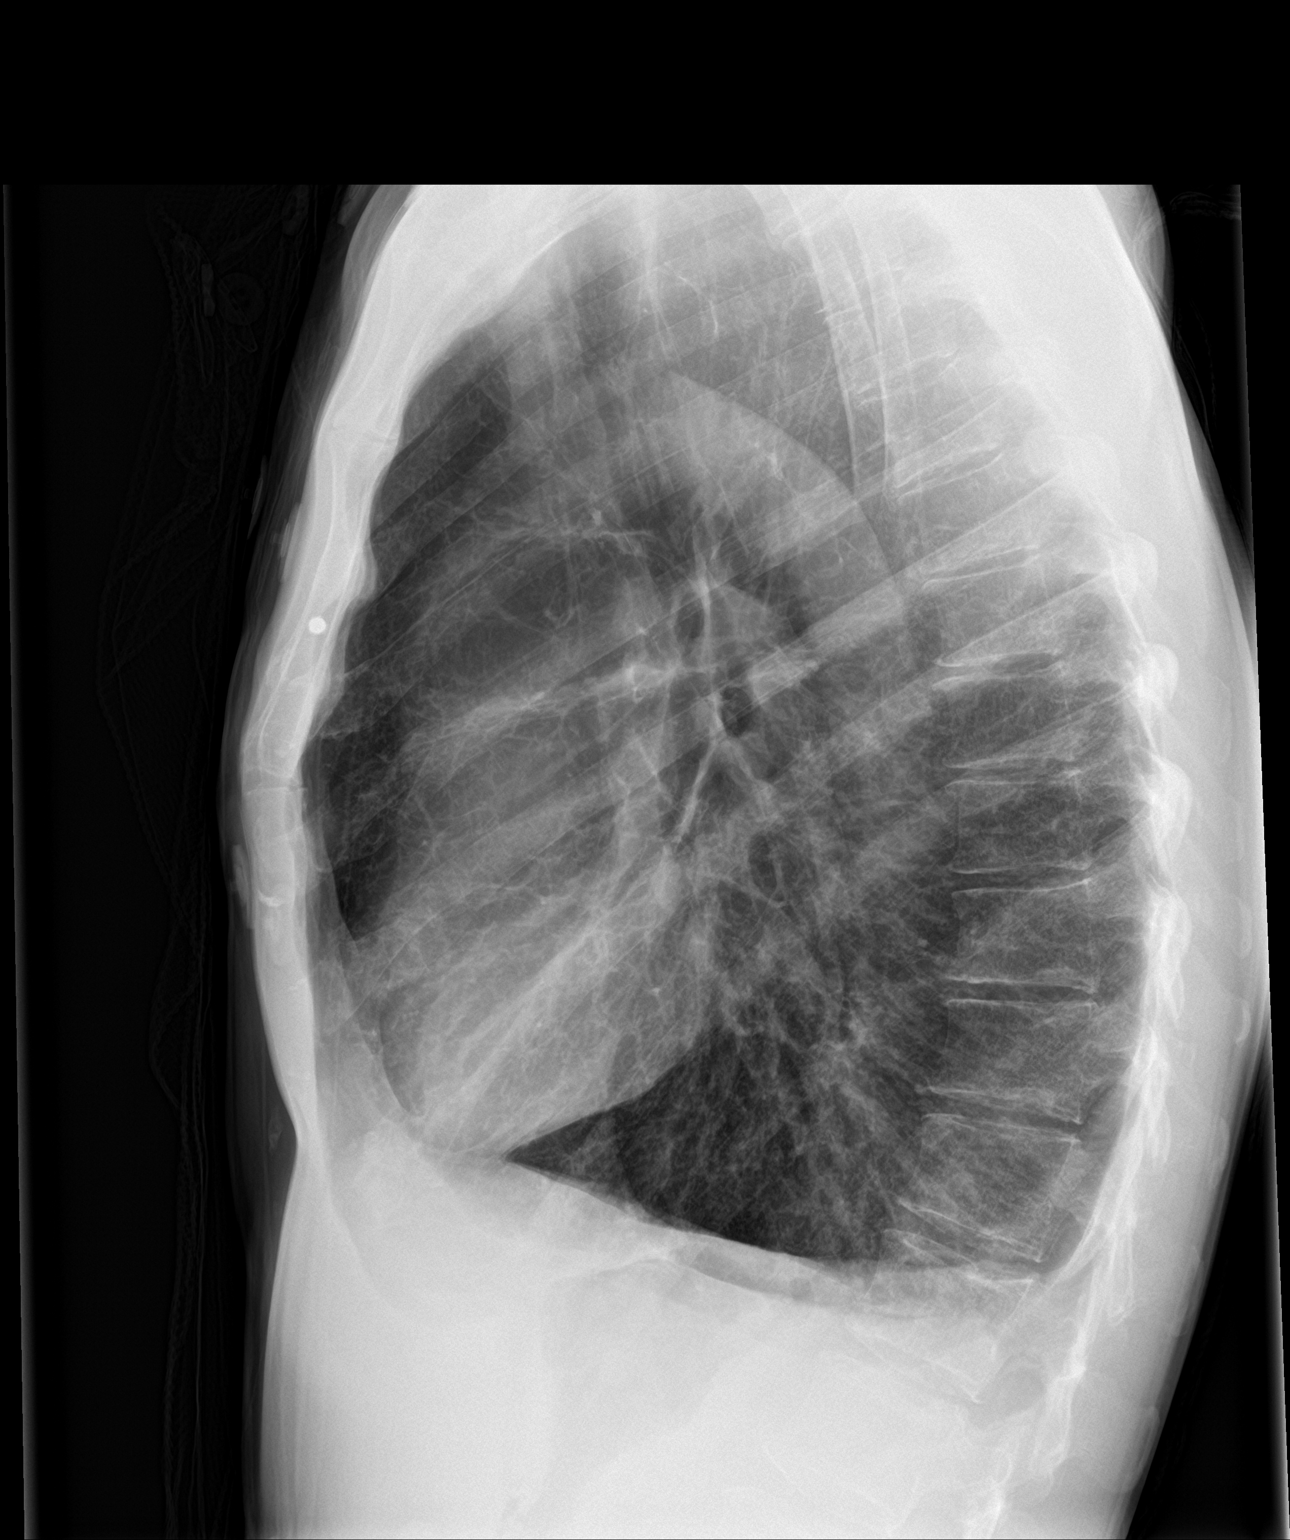

[2 of 2 positions shown; findings below may reference images not displayed]

FINDINGS: Severe COPD with hyperinflation and emphysema. No focal infiltrate
or effusion. Negative for heart failure or mass. Heart size is
relatively small.
IMPRESSION: Severe COPD without acute abnormality.

## 2020-05-11 IMAGING — CR DG CHEST 2V
2 series · 2 of 2 positions shown · non-contrast
Comparison: Prior radiograph from 08/25/2017.

CLINICAL DATA: Initial evaluation for acute chest pain.

EXAM:
CHEST - 2 VIEW

[chest pa]
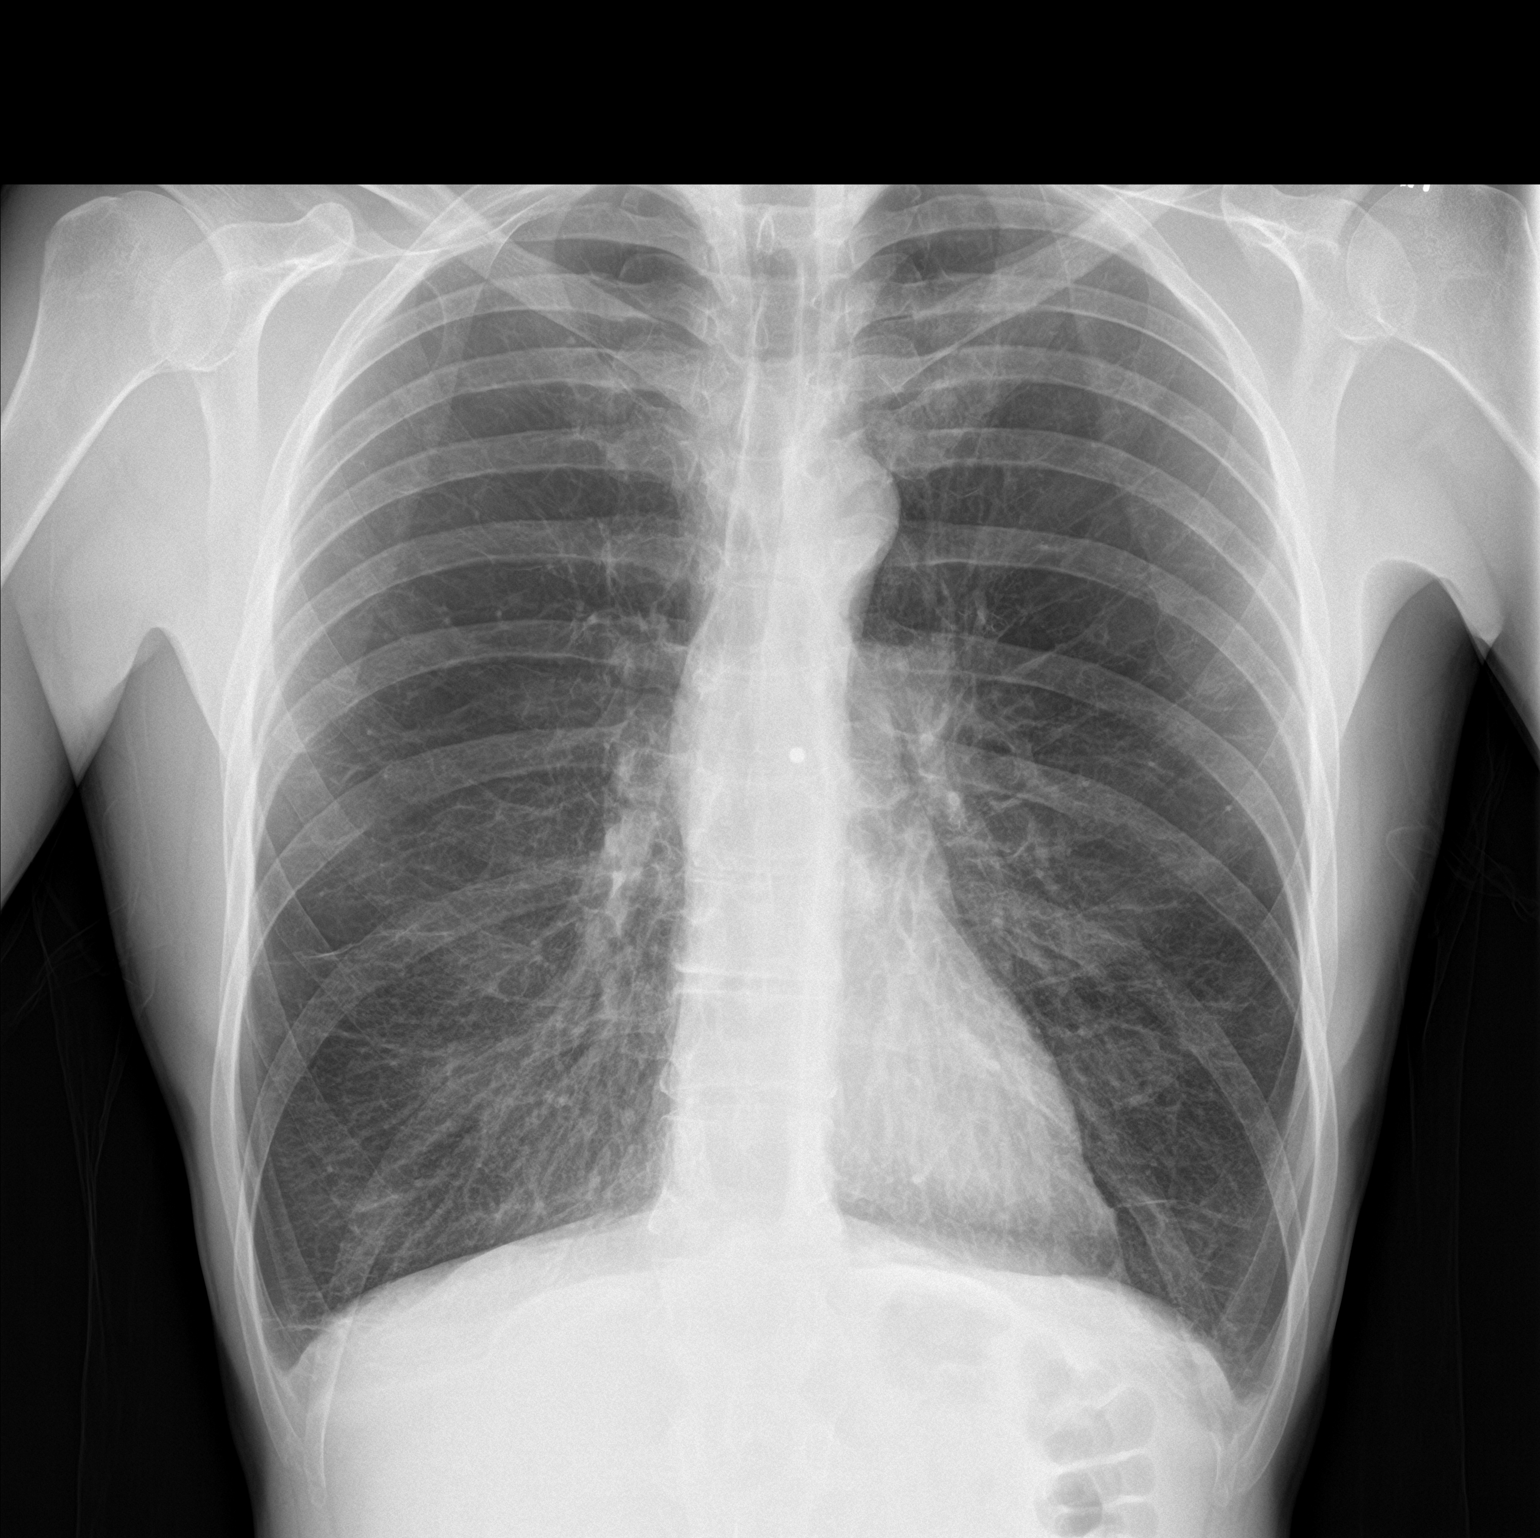

[chest lat]
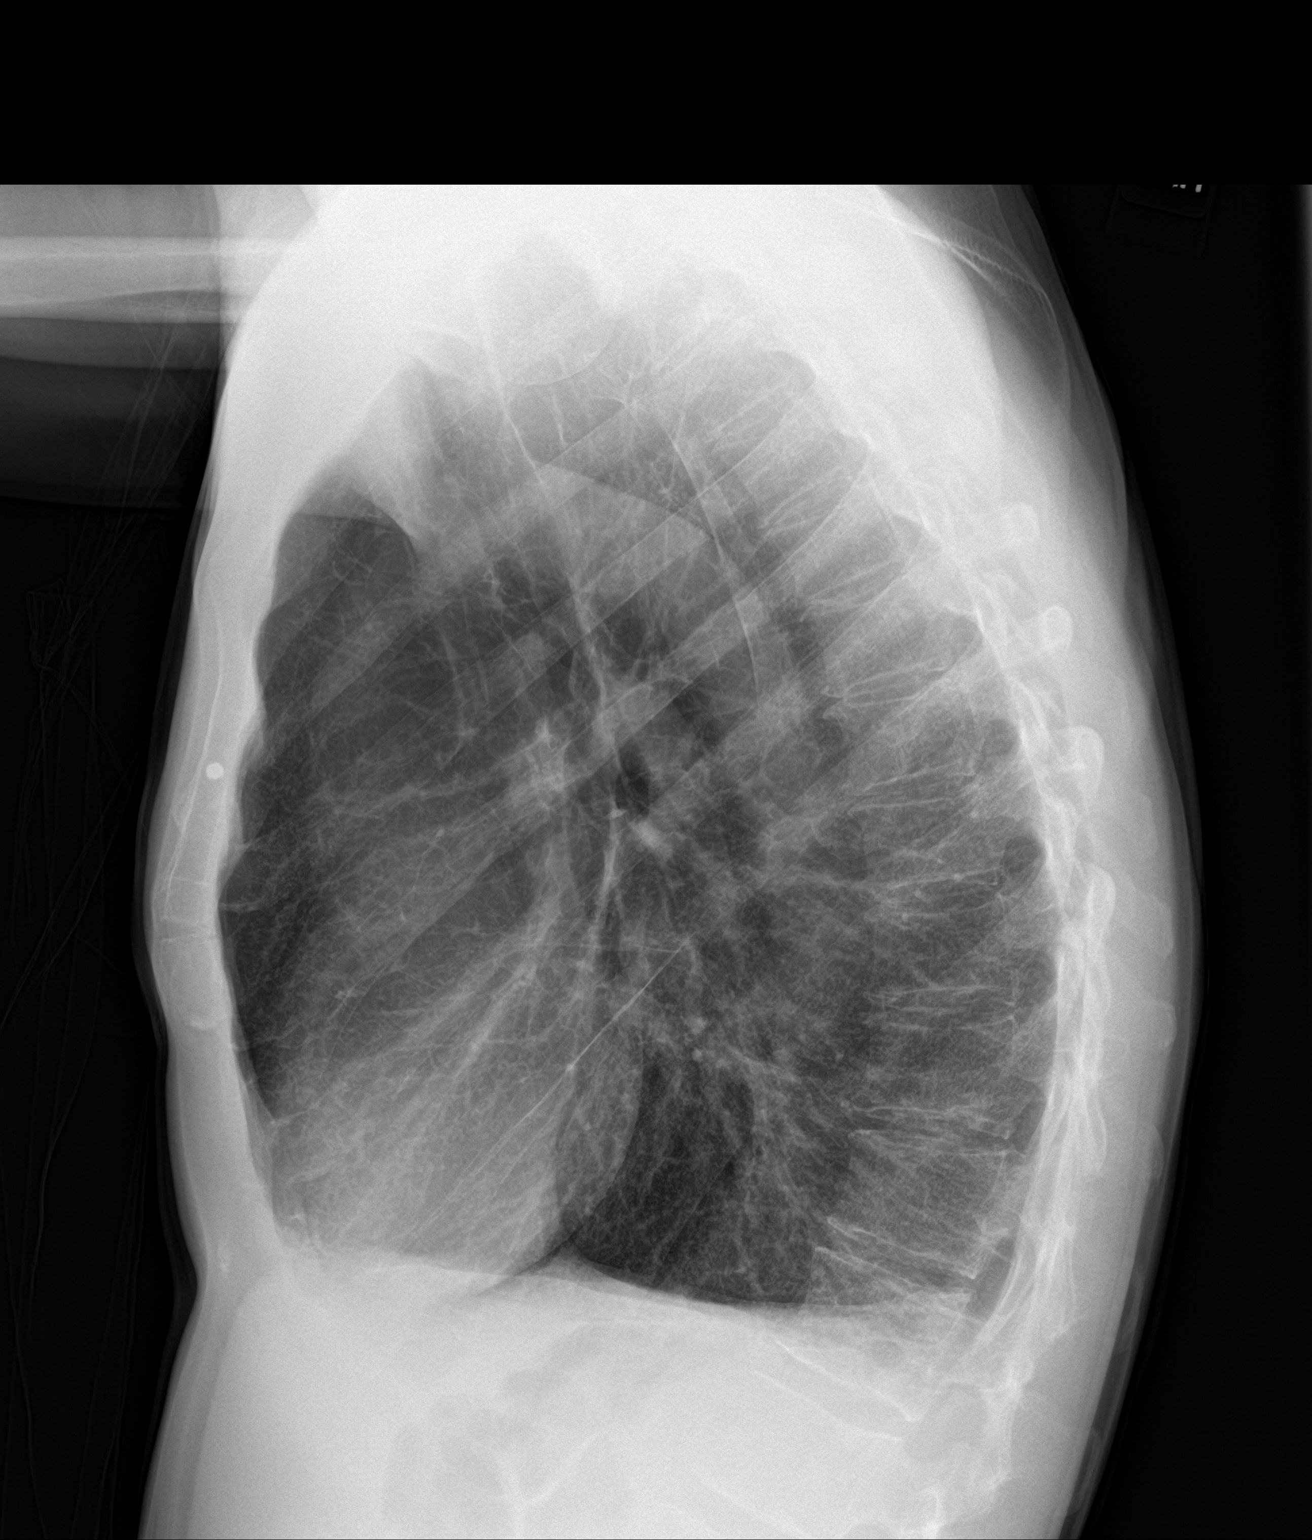

[2 of 2 positions shown; findings below may reference images not displayed]

FINDINGS: Cardiac and mediastinal silhouettes within normal limits. Aortic
atherosclerosis.

Lungs hyperinflated with underlying emphysema. No focal infiltrates.
No pulmonary edema or pleural effusion. No pneumothorax.

No acute osseous abnormality. Retained metallic density at the
sternum noted, stable.
IMPRESSION: 1. No active cardiopulmonary disease.
2. Emphysema.
